# Patient Record
Sex: Female | Born: 1990 | Race: Black or African American | Hispanic: No | Marital: Married | Smoking: Never smoker
Health system: Southern US, Community
[De-identification: ages and names within clinical notes are randomized; demographics above are authoritative.]

## PROBLEM LIST (undated history)

## (undated) DIAGNOSIS — Z789 Other specified health status: Secondary | ICD-10-CM

## (undated) DIAGNOSIS — T7840XA Allergy, unspecified, initial encounter: Secondary | ICD-10-CM

## (undated) HISTORY — DX: Other specified health status: Z78.9

## (undated) HISTORY — PX: WISDOM TOOTH EXTRACTION: SHX21

## (undated) HISTORY — DX: Allergy, unspecified, initial encounter: T78.40XA

---

## 2016-07-07 DIAGNOSIS — J3089 Other allergic rhinitis: Secondary | ICD-10-CM | POA: Insufficient documentation

## 2016-07-07 DIAGNOSIS — J301 Allergic rhinitis due to pollen: Secondary | ICD-10-CM

## 2016-07-07 DIAGNOSIS — R040 Epistaxis: Secondary | ICD-10-CM | POA: Insufficient documentation

## 2016-11-12 ENCOUNTER — Encounter: Payer: Self-pay | Admitting: Certified Nurse Midwife

## 2016-11-12 ENCOUNTER — Other Ambulatory Visit (HOSPITAL_COMMUNITY)
Admission: RE | Admit: 2016-11-12 | Discharge: 2016-11-12 | Disposition: A | Discharge status: Home / Self Care | Payer: BLUE CROSS/BLUE SHIELD | Source: Ambulatory Visit | Attending: Obstetrics & Gynecology | Admitting: Obstetrics & Gynecology

## 2016-11-12 ENCOUNTER — Ambulatory Visit (INDEPENDENT_AMBULATORY_CARE_PROVIDER_SITE_OTHER): Payer: BLUE CROSS/BLUE SHIELD | Admitting: Certified Nurse Midwife

## 2016-11-12 VITALS — BP 100/70 | HR 90 | Resp 16 | Ht 63.5 in | Wt 121.0 lb

## 2016-11-12 DIAGNOSIS — Z124 Encounter for screening for malignant neoplasm of cervix: Secondary | ICD-10-CM

## 2016-11-12 DIAGNOSIS — Z30011 Encounter for initial prescription of contraceptive pills: Secondary | ICD-10-CM | POA: Diagnosis not present

## 2016-11-12 DIAGNOSIS — Z01419 Encounter for gynecological examination (general) (routine) without abnormal findings: Secondary | ICD-10-CM

## 2016-11-12 MED ORDER — NORETHIN ACE-ETH ESTRAD-FE 1-20 MG-MCG PO TABS: 1.0000 | ORAL_TABLET | Freq: Every day | ORAL | 4 refills | Status: DC

## 2016-11-12 NOTE — Patient Instructions (Signed)
General topics  Next pap or exam is  due in 1 year Take a Women's multivitamin Take 1200 mg. of calcium daily - prefer dietary If any concerns in interim to call back  Breast Self-Awareness Practicing breast self-awareness may pick up problems early, prevent significant medical complications, and possibly save your life. By practicing breast self-awareness, you can become familiar with how your breasts look and feel and if your breasts are changing. This allows you to notice changes early. It can also offer you some reassurance that your breast health is good. One way to learn what is normal for your breasts and whether your breasts are changing is to do a breast self-exam. If you find a lump or something that was not present in the past, it is best to contact your caregiver right away. Other findings that should be evaluated by your caregiver include nipple discharge, especially if it is bloody; skin changes or reddening; areas where the skin seems to be pulled in (retracted); or new lumps and bumps. Breast pain is seldom associated with cancer (malignancy), but should also be evaluated by a caregiver. BREAST SELF-EXAM The best time to examine your breasts is 5 7 days after your menstrual period is over.  ExitCare Patient Information 2013 [employer].   Exercise to Stay Healthy Exercise helps you become and stay healthy. EXERCISE IDEAS AND TIPS Choose exercises that:  You enjoy.  Fit into your day. You do not need to exercise really hard to be healthy. You can do exercises at a slow or medium level and stay healthy. You can:  Stretch before and after working out.  Try yoga, Pilates, or tai chi.  Lift weights.  Walk fast, swim, jog, run, climb stairs, bicycle, dance, or rollerskate.  Take aerobic classes. Exercises that burn about 150 calories:  Running 1  miles in 15 minutes.  Playing volleyball for 45 to 60 minutes.  Washing and waxing a car for 45 to 60  minutes.  Playing touch football for 45 minutes.  Walking 1  miles in 35 minutes.  Pushing a stroller 1  miles in 30 minutes.  Playing basketball for 30 minutes.  Raking leaves for 30 minutes.  Bicycling 5 miles in 30 minutes.  Walking 2 miles in 30 minutes.  Dancing for 30 minutes.  Shoveling snow for 15 minutes.  Swimming laps for 20 minutes.  Walking up stairs for 15 minutes.  Bicycling 4 miles in 15 minutes.  Gardening for 30 to 45 minutes.  Jumping rope for 15 minutes.  Washing windows or floors for 45 to 60 minutes. Document Released: 03/28/2010 Document Revised: 05/18/2011 Document Reviewed: 03/28/2010 ExitCare Patient Information 2013 [employer].   Other topics ( that may be useful information):    Sexually Transmitted Disease Sexually transmitted disease (STD) refers to any infection that is passed from person to person during sexual activity. This may happen by way of saliva, semen, blood, vaginal mucus, or urine. Common STDs include:  Gonorrhea.  Chlamydia.  Syphilis.  HIV/AIDS.  Genital herpes.  Hepatitis B and C.  Trichomonas.  Human papillomavirus (HPV).  Pubic lice. CAUSES  An STD may be spread by bacteria, virus, or parasite. A person can get an STD by:  Sexual intercourse with an infected person.  Sharing sex toys with an infected person.  Sharing needles with an infected person.  Having intimate contact with the genitals, mouth, or rectal areas of an infected person. SYMPTOMS  Some people may not have any symptoms, but   they can still pass the infection to others. Different STDs have different symptoms. Symptoms include:  Painful or bloody urination.  Pain in the pelvis, abdomen, vagina, anus, throat, or eyes.  Skin rash, itching, irritation, growths, or sores (lesions). These usually occur in the genital or anal area.  Abnormal vaginal discharge.  Penile discharge in men.  Soft, flesh-colored skin growths in the  genital or anal area.  Fever.  Pain or bleeding during sexual intercourse.  Swollen glands in the groin area.  Yellow skin and eyes (jaundice). This is seen with hepatitis. DIAGNOSIS  To make a diagnosis, your caregiver may:  Take a medical history.  Perform a physical exam.  Take a specimen (culture) to be examined.  Examine a sample of discharge under a microscope.  Perform blood test TREATMENT   Chlamydia, gonorrhea, trichomonas, and syphilis can be cured with antibiotic medicine.  Genital herpes, hepatitis, and HIV can be treated, but not cured, with prescribed medicines. The medicines will lessen the symptoms.  Genital warts from HPV can be treated with medicine or by freezing, burning (electrocautery), or surgery. Warts may come back.  HPV is a virus and cannot be cured with medicine or surgery.However, abnormal areas may be followed very closely by your caregiver and may be removed from the cervix, vagina, or vulva through office procedures or surgery. If your diagnosis is confirmed, your recent sexual partners need treatment. This is true even if they are symptom-free or have a negative culture or evaluation. They should not have sex until their caregiver says it is okay. HOME CARE INSTRUCTIONS  All sexual partners should be informed, tested, and treated for all STDs.  Take your antibiotics as directed. Finish them even if you start to feel better.  Only take over-the-counter or prescription medicines for pain, discomfort, or fever as directed by your caregiver.  Rest.  Eat a balanced diet and drink enough fluids to keep your urine clear or pale yellow.  Do not have sex until treatment is completed and you have followed up with your caregiver. STDs should be checked after treatment.  Keep all follow-up appointments, Pap tests, and blood tests as directed by your caregiver.  Only use latex condoms and water-soluble lubricants during sexual activity. Do not use  petroleum jelly or oils.  Avoid alcohol and illegal drugs.  Get vaccinated for HPV and hepatitis. If you have not received these vaccines in the past, talk to your caregiver about whether one or both might be right for you.  Avoid risky sex practices that can break the skin. The only way to avoid getting an STD is to avoid all sexual activity.Latex condoms and dental dams (for oral sex) will help lessen the risk of getting an STD, but will not completely eliminate the risk. SEEK MEDICAL CARE IF:   You have a fever.  You have any new or worsening symptoms. Document Released: 05/16/2002 Document Revised: 05/18/2011 Document Reviewed: 05/23/2010 [facility] Patient Information 2013 [place].    Domestic Abuse You are being battered or abused if someone close to you hits, pushes, or physically hurts you in any way. You also are being abused if you are forced into activities. You are being sexually abused if you are forced to have sexual contact of any kind. You are being emotionally abused if you are made to feel worthless or if you are constantly threatened. It is important to remember that help is available. No one has the right to abuse you. PREVENTION OF FURTHER  ABUSE  Learn the warning signs of danger. This varies with situations but may include: the use of alcohol, threats, isolation from friends and family, or forced sexual contact. Leave if you feel that violence is going to occur.  If you are attacked or beaten, report it to the police so the abuse is documented. You do not have to press charges. The police can protect you while you or the attackers are leaving. Get the officer's name and badge number and a copy of the report.  Find someone you can trust and tell them what is happening to you: your caregiver, a nurse, clergy member, close friend or family member. Feeling ashamed is natural, but remember that you have done nothing wrong. No one deserves abuse. Document Released:  02/21/2000 Document Revised: 05/18/2011 Document Reviewed: 05/01/2010 ExitCare Patient Information 2013 [employer].    How Much is Too Much Alcohol? Drinking too much alcohol can cause injury, accidents, and health problems. These types of problems can include:   Car crashes.  Falls.  Family fighting (domestic violence).  Drowning.  Fights.  Injuries.  Burns.  Damage to certain organs.  Having a baby with birth defects. ONE DRINK CAN BE TOO MUCH WHEN YOU ARE:  Working.  Pregnant or breastfeeding.  Taking medicines. Ask your doctor.  Driving or planning to drive. If you or someone you know has a drinking problem, get help from a doctor.  Document Released: 12/20/2008 Document Revised: 05/18/2011 Document Reviewed: 12/20/2008 ExitCare Patient Information 2013 [employer]Care[employer].   Smoking Hazards Smoking cigarettes is extremely bad for your health. Tobacco smoke has over 200 known poisons in it. There are over 60 chemicals in tobacco smoke that cause cancer. Some of the chemicals found in cigarette smoke include:   Cyanide.  Benzene.  Formaldehyde.  Methanol (wood alcohol).  Acetylene (fuel used in welding torches).  Ammonia. Cigarette smoke also contains the poisonous gases nitrogen oxide and carbon monoxide.  Cigarette smokers have an increased risk of many serious medical problems and Smoking causes approximately:  90% of all lung cancer deaths in men.  80% of all lung cancer deaths in women.  90% of deaths from chronic obstructive lung disease. Compared with nonsmokers, smoking increases the risk of:  Coronary heart disease by 2 to 4 times.  Stroke by 2 to 4 times.  Men developing lung cancer by 23 times.  Women developing lung cancer by 13 times.  Dying from chronic obstructive lung diseases by 12 times.  . Smoking is the most preventable cause of death and disease in our society.  WHY IS SMOKING ADDICTIVE?  Nicotine is the chemical  agent in tobacco that is capable of causing addiction or dependence.  When you smoke and inhale, nicotine is absorbed rapidly into the bloodstream through your lungs. Nicotine absorbed through the lungs is capable of creating a powerful addiction. Both inhaled and non-inhaled nicotine may be addictive.  Addiction studies of cigarettes and spit tobacco show that addiction to nicotine occurs mainly during the teen years, when young people begin using tobacco products. WHAT ARE THE BENEFITS OF QUITTING?  There are many health benefits to quitting smoking.   Likelihood of developing cancer and heart disease decreases. Health improvements are seen almost immediately.  Blood pressure, pulse rate, and breathing patterns start returning to normal soon after quitting. QUITTING SMOKING   American Lung Association - 1-800-LUNGUSA  American Cancer Society - 1-800-ACS-2345 Document Released: 04/02/2004 Document Revised: 05/18/2011 Document Reviewed: 12/05/2008 ExitCare Patient Information 2013 ExitCare,   LLC.   Stress Management Stress is a state of physical or mental tension that often results from changes in your life or normal routine. Some common causes of stress are:  Death of a loved one.  Injuries or severe illnesses.  Getting fired or changing jobs.  Moving into a new home. Other causes may be:  Sexual problems.  Business or financial losses.  Taking on a large debt.  Regular conflict with someone at home or at work.  Constant tiredness from lack of sleep. It is not just bad things that are stressful. It may be stressful to:  Win the lottery.  Get married.  Buy a new car. The amount of stress that can be easily tolerated varies from person to person. Changes generally cause stress, regardless of the types of change. Too much stress can affect your health. It may lead to physical or emotional problems. Too little stress (boredom) may also become stressful. SUGGESTIONS TO  REDUCE STRESS:  Talk things over with your family and friends. It often is helpful to share your concerns and worries. If you feel your problem is serious, you may want to get help from a professional counselor.  Consider your problems one at a time instead of lumping them all together. Trying to take care of everything at once may seem impossible. List all the things you need to do and then start with the most important one. Set a goal to accomplish 2 or 3 things each day. If you expect to do too many in a single day you will naturally fail, causing you to feel even more stressed.  Do not use alcohol or drugs to relieve stress. Although you may feel better for a short time, they do not remove the problems that caused the stress. They can also be habit forming.  Exercise regularly - at least 3 times per week. Physical exercise can help to relieve that "uptight" feeling and will relax you.  The shortest distance between despair and hope is often a good night's sleep.  Go to bed and get up on time allowing yourself time for appointments without being rushed.  Take a short "time-out" period from any stressful situation that occurs during the day. Close your eyes and take some deep breaths. Starting with the muscles in your face, tense them, hold it for a few seconds, then relax. Repeat this with the muscles in your neck, shoulders, hand, stomach, back and legs.  Take good care of yourself. Eat a balanced diet and get plenty of rest.  Schedule time for having fun. Take a break from your daily routine to relax. HOME CARE INSTRUCTIONS   Call if you feel overwhelmed by your problems and feel you can no longer manage them on your own.  Return immediately if you feel like hurting yourself or someone else. Document Released: 08/19/2000 Document Revised: 05/18/2011 Document Reviewed: 04/11/2007 [facility] Patient Information 2013 [employer].   Oral Contraception Use Oral contraceptive pills  (OCPs) are medicines taken to prevent pregnancy. OCPs work by preventing the ovaries from releasing eggs. The hormones in OCPs also cause the cervical mucus to thicken, preventing the sperm from entering the uterus. The hormones also cause the uterine lining to become thin, not allowing a fertilized egg to attach to the inside of the uterus. OCPs are highly effective when taken exactly as prescribed. However, OCPs do not prevent sexually transmitted diseases (STDs). Safe sex practices, such as using condoms along with an OCP, can help prevent STDs.  Before taking OCPs, you may have a physical exam and Pap test. Your health care provider may also order blood tests if necessary. Your health care provider will make sure you are a good candidate for oral contraception. Discuss with your health care provider the possible side effects of the OCP you may be prescribed. When starting an OCP, it can take 2 to 3 months for the body to adjust to the changes in hormone levels in your body. How to take oral contraceptive pills Your health care provider may advise you on how to start taking the first cycle of OCPs. Otherwise, you can:  Start on day 1 of your menstrual period. You will not need any backup contraceptive protection with this start time.  Start on the first Sunday after your menstrual period or the day you get your prescription. In these cases, you will need to use backup contraceptive protection for the first week.  Start the pill at any time of your cycle. If you take the pill within 5 days of the start of your period, you are protected against pregnancy right away. In this case, you will not need a backup form of birth control. If you start at any other time of your menstrual cycle, you will need to use another form of birth control for 7 days. If your OCP is the type called a minipill, it will protect you from pregnancy after taking it for 2 days (48 hours).  After you have started taking OCPs:  If  you forget to take 1 pill, take it as soon as you remember. Take the next pill at the regular time.  If you miss 2 or more pills, call your health care provider because different pills have different instructions for missed doses. Use backup birth control until your next menstrual period starts.  If you use a 28-day pack that contains inactive pills and you miss 1 of the last 7 pills (pills with no hormones), it will not matter. Throw away the rest of the non-hormone pills and start a new pill pack.  No matter which day you start the OCP, you will always start a new pack on that same day of the week. Have an extra pack of OCPs and a backup contraceptive method available in case you miss some pills or lose your OCP pack. Follow these instructions at home:  Do not smoke.  Always use a condom to protect against STDs. OCPs do not protect against STDs.  Use a calendar to mark your menstrual period days.  Read the information and directions that came with your OCP. Talk to your health care provider if you have questions. Contact a health care provider if:  You develop nausea and vomiting.  You have abnormal vaginal discharge or bleeding.  You develop a rash.  You miss your menstrual period.  You are losing your hair.  You need treatment for mood swings or depression.  You get dizzy when taking the OCP.  You develop acne from taking the OCP.  You become pregnant. Get help right away if:  You develop chest pain.  You develop shortness of breath.  You have an uncontrolled or severe headache.  You develop numbness or slurred speech.  You develop visual problems.  You develop pain, redness, and swelling in the legs. This information is not intended to replace advice given to you by your health care provider. Make sure you discuss any questions you have with your health care provider. Document Released: 02/12/2011  Document Revised: 08/01/2015 Document Reviewed:  08/14/2012 Elsevier Interactive Patient Education  2017 [employer]vier Inc.  

## 2016-11-12 NOTE — Progress Notes (Signed)
26 y.o. G0P0000 Single  African American Fe here to establish gyn care and for annual exam. Periods normal, duration 3-4 days,one heavy day first day only, minimal cramping with OCP use. Denies any problems with OCP and would like to continue. Sexually active, no STD screening needed. Have been together 5 years. Sees Urgent care if needed. No health issues today.    Patient's last menstrual period was 10/19/2016.          Sexually active: Yes.    The current method of family planning is OCP (estrogen/progesterone).    Exercising: No.  The patient does not participate in regular exercise at present. Smoker:  no  Health Maintenance: Pap:  09/2015 normal  History of Abnormal Pap: no MMG:  Never Self Breast exams: no, knows how TDaP: ~2009 Hep C and HIV: Done Labs: if needed, such had health screening all normal   reports that she has never smoked. She has never used smokeless tobacco. She reports that she drinks about 0.6 oz of alcohol per week . She reports that she does not use drugs.  History reviewed. No pertinent past medical history.  History reviewed. No pertinent surgical history.  Current Outpatient Prescriptions  Medication Sig Dispense Refill  . diphenhydrAMINE (BENADRYL) 25 MG tablet Take 25 mg by mouth every 8 (eight) hours as needed.    Marland Kitchen. ibuprofen (ADVIL) 200 MG tablet Take 200 mg by mouth every 6 (six) hours as needed.    . norethindrone-ethinyl estradiol (JUNEL FE 1/20) 1-20 MG-MCG tablet Take 1 tablet by mouth daily.     No current facility-administered medications for this visit.     Family History  Problem Relation Age of Onset  . Rheum arthritis Mother   . Osteoporosis Mother     ROS:  Pertinent items are noted in HPI.  Otherwise, a comprehensive ROS was negative.  Exam:   BP 100/70 (BP Location: Right Arm, Patient Position: Sitting, Cuff Size: Normal)   Pulse 90   Resp 16   Ht 5' 3.5" (1.613 m)   Wt 121 lb (54.9 kg)   LMP 10/19/2016   BMI 21.10 kg/m   Height: 5' 3.5" (161.3 cm) Ht Readings from Last 3 Encounters:  11/12/16 5' 3.5" (1.613 m)    General appearance: alert, cooperative and appears stated age Head: Normocephalic, without obvious abnormality, atraumatic Neck: no adenopathy, supple, symmetrical, trachea midline and thyroid normal to inspection and palpation Lungs: clear to auscultation bilaterally Breasts: normal appearance, no masses or tenderness, No nipple retraction or dimpling, No nipple discharge or bleeding, No axillary or supraclavicular adenopathy Heart: regular rate and rhythm Abdomen: soft, non-tender; no masses,  no organomegaly Extremities: extremities normal, atraumatic, no cyanosis or edema Skin: Skin color, texture, turgor normal. No rashes or lesions Lymph nodes: Cervical, supraclavicular, and axillary nodes normal. No abnormal inguinal nodes palpated Neurologic: Grossly normal   Pelvic: External genitalia:  no lesions              Urethra:  normal appearing urethra with no masses, tenderness or lesions              Bartholin's and Skene's: normal                 Vagina: normal appearing vagina with normal color and discharge, no lesions              Cervix: no cervical motion tenderness, no lesions and nulliparous appearance  Pap taken: Yes.   Bimanual Exam:  Uterus:  normal size, contour, position, consistency, mobility, non-tender              Adnexa: normal adnexa and no mass, fullness, tenderness               Rectovaginal: Confirms               Anus:  normal appearance  Chaperone present: yes  A:  Well Woman with normal exam  Contraception OCP desired, no contraindications to use  P:   Reviewed health and wellness pertinent to exam  Rx Junel Fe 1/20  See order with instructions  Pap smear: yes   counseled on breast self exam, STD prevention, HIV risk factors and prevention, use and side effects of OCP's, adequate intake of calcium and vitamin D, diet and exercise  return  annually or prn  An After Visit Summary was printed and given to the patient.

## 2016-11-13 ENCOUNTER — Ambulatory Visit: Payer: BLUE CROSS/BLUE SHIELD | Admitting: Certified Nurse Midwife

## 2016-11-13 LAB — CYTOLOGY - PAP: Diagnosis: NEGATIVE

## 2017-07-22 DIAGNOSIS — Z3041 Encounter for surveillance of contraceptive pills: Secondary | ICD-10-CM | POA: Insufficient documentation

## 2017-07-22 DIAGNOSIS — L600 Ingrowing nail: Secondary | ICD-10-CM | POA: Insufficient documentation

## 2017-11-18 ENCOUNTER — Ambulatory Visit: Payer: BLUE CROSS/BLUE SHIELD | Admitting: Certified Nurse Midwife

## 2017-11-23 ENCOUNTER — Ambulatory Visit: Payer: BLUE CROSS/BLUE SHIELD | Admitting: Certified Nurse Midwife

## 2017-12-17 ENCOUNTER — Other Ambulatory Visit: Payer: Self-pay

## 2017-12-17 ENCOUNTER — Ambulatory Visit (INDEPENDENT_AMBULATORY_CARE_PROVIDER_SITE_OTHER): Payer: BLUE CROSS/BLUE SHIELD | Admitting: Certified Nurse Midwife

## 2017-12-17 ENCOUNTER — Encounter: Payer: Self-pay | Admitting: Certified Nurse Midwife

## 2017-12-17 VITALS — BP 98/68 | HR 60 | Resp 16 | Ht 63.25 in | Wt 115.0 lb

## 2017-12-17 DIAGNOSIS — Z3041 Encounter for surveillance of contraceptive pills: Secondary | ICD-10-CM

## 2017-12-17 DIAGNOSIS — Z01419 Encounter for gynecological examination (general) (routine) without abnormal findings: Secondary | ICD-10-CM | POA: Diagnosis not present

## 2017-12-17 MED ORDER — NORETHIN ACE-ETH ESTRAD-FE 1-20 MG-MCG PO TABS: 1.0000 | ORAL_TABLET | Freq: Every day | ORAL | 4 refills | Status: DC

## 2017-12-17 NOTE — Progress Notes (Signed)
27 y.o. G0P0000 Single  African American Fe here for annual exam. Periods normal no issues. Contraception OCP. No missed pills or periods. Urgent care for sinus infections only. No partner change engaged!!  No health issues today. Wedding maybe in [place].  Patient's last menstrual period was 11/22/2017 (exact date).          Sexually active: Yes.    The current method of family planning is OCP (estrogen/progesterone).    Exercising: No.  exercise Smoker:  no  Review of Systems  Constitutional: Negative.   HENT: Negative.   Eyes: Negative.   Respiratory: Negative.   Cardiovascular: Negative.   Gastrointestinal: Negative.   Genitourinary:       Loss of sexual interest  Musculoskeletal: Negative.   Skin: Negative.   Neurological: Negative.   Endo/Heme/Allergies: Negative.   Psychiatric/Behavioral: Negative.     Health Maintenance: Pap:  11-12-16 neg History of Abnormal Pap: no MMG:  none Self Breast exams: occ Colonoscopy:  none BMD:   none TDaP:  UTD? Will check Shingles: no Pneumonia: no Hep C and HIV: not done Labs: no   reports that she has never smoked. She has never used smokeless tobacco. She reports that she drinks alcohol. She reports that she does not use drugs.  History reviewed. No pertinent past medical history.  History reviewed. No pertinent surgical history.  Current Outpatient Medications  Medication Sig Dispense Refill  . diphenhydrAMINE (BENADRYL) 25 MG tablet Take 25 mg by mouth every 8 (eight) hours as needed.    . fluticasone (FLONASE) 50 MCG/ACT nasal spray Place into both nostrils daily.    Marland Kitchen ibuprofen (ADVIL) 200 MG tablet Take 200 mg by mouth every 6 (six) hours as needed.    . norethindrone-ethinyl estradiol (JUNEL FE 1/20) 1-20 MG-MCG tablet Take 1 tablet by mouth daily. 3 Package 4   No current facility-administered medications for this visit.     Family History  Problem Relation Age of Onset  . Rheum arthritis Mother   . Osteoporosis  Mother     ROS:  Pertinent items are noted in HPI.  Otherwise, a comprehensive ROS was negative.  Exam:   BP 98/68   Pulse 60   Resp 16   Ht 5' 3.25" (1.607 m)   Wt 115 lb (52.2 kg)   LMP 11/22/2017 (Exact Date)   BMI 20.21 kg/m  Height: 5' 3.25" (160.7 cm) Ht Readings from Last 3 Encounters:  12/17/17 5' 3.25" (1.607 m)  11/12/16 5' 3.5" (1.613 m)    General appearance: alert, cooperative and appears stated age Head: Normocephalic, without obvious abnormality, atraumatic Neck: no adenopathy, supple, symmetrical, trachea midline and thyroid normal to inspection and palpation Lungs: clear to auscultation bilaterally Breasts: normal appearance, no masses or tenderness, No nipple retraction or dimpling, No nipple discharge or bleeding, No axillary or supraclavicular adenopathy Heart: regular rate and rhythm Abdomen: soft, non-tender; no masses,  no organomegaly Extremities: extremities normal, atraumatic, no cyanosis or edema Skin: Skin color, texture, turgor normal. No rashes or lesions Lymph nodes: Cervical, supraclavicular, and axillary nodes normal. No abnormal inguinal nodes palpated Neurologic: Grossly normal   Pelvic: External genitalia:  no lesions              Urethra:  normal appearing urethra with no masses, tenderness or lesions              Bartholin's and Skene's: normal  Vagina: normal appearing vagina with normal color and discharge, no lesions              Cervix: no cervical motion tenderness, no lesions and nulliparous appearance              Pap taken: No. Bimanual Exam:  Uterus:  normal size, contour, position, consistency, mobility, non-tender and anteverted              Adnexa: normal adnexa and no mass, fullness, tenderness               Rectovaginal: Confirms               Anus:  normal appearance, no lesions  Chaperone present: yes  A:  Well Woman with normal exam  Contraception OCP desired  May plan pregnancy in next year  P:    Reviewed health and wellness pertinent to exam  Risks/benefits/warning signs reviewed, denies any problems  Rx Junel Fe 1/20 see order with instructions  Encouraged to have preconceptual appointment 3-6 months before planning to start trying. Given printed information regarding pregnancy planning.  Pap smear: no   counseled on breast self exam, feminine hygiene, use and side effects of OCP's, adequate intake of calcium and vitamin D, diet and exercise  return annually or prn  An After Visit Summary was printed and given to the patient.

## 2017-12-17 NOTE — Patient Instructions (Signed)

## 2018-03-18 ENCOUNTER — Ambulatory Visit: Payer: BLUE CROSS/BLUE SHIELD | Admitting: Certified Nurse Midwife

## 2018-03-22 ENCOUNTER — Other Ambulatory Visit: Payer: Self-pay

## 2018-03-22 ENCOUNTER — Encounter: Payer: Self-pay | Admitting: Certified Nurse Midwife

## 2018-03-22 ENCOUNTER — Ambulatory Visit (INDEPENDENT_AMBULATORY_CARE_PROVIDER_SITE_OTHER): Payer: BLUE CROSS/BLUE SHIELD | Admitting: Certified Nurse Midwife

## 2018-03-22 VITALS — BP 106/68 | HR 64 | Resp 16 | Wt 117.0 lb

## 2018-03-22 DIAGNOSIS — Z3169 Encounter for other general counseling and advice on procreation: Secondary | ICD-10-CM

## 2018-03-22 DIAGNOSIS — Z789 Other specified health status: Secondary | ICD-10-CM

## 2018-03-22 NOTE — Progress Notes (Signed)
28 y.o.Single African American g0p0  Female here for preconceptual counseling with partner. Plans to marry in the next month and would like to try for pregnancy in 3 months. Will continue OCP until that time. No health issues today.   Gynecological History:    Menarche:12 LMP:  Length of cycle:monthly Length of Menses: 4 days GYN infectious disease history:  (Abnormal pap, venereal warts, herpes, or other STD's )No Current Birth Control method:OCP Last time birth control was used:continues on daily.  PMH:  Any history of DM, HTN, epilepsy, Heart Murmur, or thyroid problems? No   If so, when did it begin? Are you or have you ever been anemic?No If so for how long? Have you ever had any accidents? No What type? Do you have any allergies? No Do you take any sedatives or tranquilizers? No Any domestic violence? No Any medications? No   (such as medicationss for acne - certain medications can cause birth defects and Ace inhibitors can cause kidney problems in the fetus.)  Patient's Past Medical History:  Have you ever had surgery related to female organs? No Past pregnancies/ complications/ or miscarriages/ abortions? No ETOH? No Tobacco Use?No Drug use? No  Reviewed Medication list: No Current job exposure risk - toxins/ Lead/ Mercury No Hot tub/ sauna use? No Do you commonly run long distance or do strenuous exercise? No Do you eat a strict vegetarian diet? No  Partners Past Medical History:  Have you ever had surgery related to female organs? No Previous Paternity? No Testicular Injury? No ETOH No Tobacco use: No Drug use No Current medications:none Current job exposure risk toxins/ lead/ mercury No Hot/ tub sauna use? No Do you commonly run long distance or do strenuous exercise? No  Works out a [employer] daily   Patient's Family Medical history: No Partners Family Medical History: No  Ethnic background? Meditterranean/ Asian/Chinese/ Ashkenazi Jews / [place]  /[place] French - [place]  Patients FMH:   Partners FMH:             Multiple Births:  father is a twin, No         Genetic Disorders No , No    Sickle cell No,No Hemophilia No, No      Cystic Fibrosis No, No     Mental retardation No, No Downs Syndrome No, No      Immunization Updates: Rubella Vaccine/ titer Yes Chicken Pox / vaccination Yes Toxoplasmosis exposure (no changing litter box) Yes has Cat, indoor only Tdap for pt. and partner Yes Hepatitis B  Yes Influenza vaccine who may get pregnant during the flu season Yes, Yes  Recommendations: Discussed with patient would encourage Rubella immune status today with blood draw. Discussed concerns with contracting Rubella in pregnancy. Patient request lab today. Discussed 80 % pregnancy rate in the first year of trying to conceive. Discussed maintaining menstrual calendar for fertile days and given BBT calendar with instructions. Questions addressed. Discussed recommendation of:  No ETOH / Tobacco / Drugs  Limit Caffeine   Artificial Sweeteners restricted with sacrine only  No raw beef or soft cheese consumption   Restrict High fat foods  Limit servings of fish and other seafood to twice weekly. No raw fish  Foods associated with Listeria transmission ( e.g., sliced delicatessen meats & Cheese)  No hot / tub Saunas  Regular exercise  Daily Prenatal vitamins   Lab: Rubella Instructed patient to call if concerns or questions. If misses period and positive  pregnancy test or negative needs to call. If no pregnancy by 9 months to call for OV. Keep regular AEX as scheduled.  Rv prn   Note: Patient became nauseated and vomited after blood draw in office area. Given water and crackers and peppermint and felt much better.  Bp 110 /68 P 72. Color normal and ambulation with assistance to front desk with partner.  Time spent with patient / significant other: 38 minutes

## 2018-03-23 ENCOUNTER — Encounter: Payer: Self-pay | Admitting: Certified Nurse Midwife

## 2018-03-23 LAB — RUBELLA SCREEN: RUBELLA: 7.1 {index} (ref >0.99)

## 2018-03-23 NOTE — Patient Instructions (Signed)

## 2018-11-30 ENCOUNTER — Encounter: Payer: Self-pay | Admitting: Allergy and Immunology

## 2018-11-30 ENCOUNTER — Ambulatory Visit (INDEPENDENT_AMBULATORY_CARE_PROVIDER_SITE_OTHER): Payer: BC Managed Care – PPO | Admitting: Allergy and Immunology

## 2018-11-30 ENCOUNTER — Other Ambulatory Visit: Payer: Self-pay

## 2018-11-30 VITALS — BP 118/70 | HR 106 | Temp 97.6°F | Resp 16 | Ht 63.39 in | Wt 118.4 lb

## 2018-11-30 DIAGNOSIS — J4520 Mild intermittent asthma, uncomplicated: Secondary | ICD-10-CM | POA: Insufficient documentation

## 2018-11-30 DIAGNOSIS — H1013 Acute atopic conjunctivitis, bilateral: Secondary | ICD-10-CM

## 2018-11-30 DIAGNOSIS — J3089 Other allergic rhinitis: Secondary | ICD-10-CM | POA: Diagnosis not present

## 2018-11-30 DIAGNOSIS — H1010 Acute atopic conjunctivitis, unspecified eye: Secondary | ICD-10-CM | POA: Insufficient documentation

## 2018-11-30 MED ORDER — LEVOCETIRIZINE DIHYDROCHLORIDE 5 MG PO TABS: 5.0000 mg | ORAL_TABLET | Freq: Every day | ORAL | 5 refills | Status: DC | PRN

## 2018-11-30 MED ORDER — ALBUTEROL SULFATE HFA 108 (90 BASE) MCG/ACT IN AERS: INHALATION_SPRAY | RESPIRATORY_TRACT | 1 refills | Status: DC

## 2018-11-30 MED ORDER — PAZEO 0.7 % OP SOLN: 1.0000 [drp] | Freq: Every day | OPHTHALMIC | 5 refills | Status: DC | PRN

## 2018-11-30 MED ORDER — XHANCE 93 MCG/ACT NA EXHU: 2.0000 "application " | INHALANT_SUSPENSION | Freq: Two times a day (BID) | NASAL | 5 refills | Status: DC

## 2018-11-30 NOTE — Assessment & Plan Note (Signed)
   Aeroallergen avoidance measures have been discussed and provided in written form.  A prescription has been provided for levocetirizine, 5 mg daily as needed.  To avoid diminishing benefit with daily use (tachyphylaxis) of second generation antihistamine, consider alternating every few months between fexofenadine (Allegra) and levocetirizine (Xyzal).  A prescription has been provided for [facility], 2 actuations per nostril twice a day. Proper technique has been discussed and demonstrated.  Nasal saline spray (i.e., Simply Saline) or nasal saline lavage (i.e., NeilMed) is recommended as needed and prior to medicated nasal sprays.  If allergen avoidance measures and medications fail to adequately relieve symptoms, aeroallergen immunotherapy will be considered.

## 2018-11-30 NOTE — Assessment & Plan Note (Addendum)
   Continue to have access to albuterol HFA, 1 to 2 inhalations every 4-6 hours if needed.  Subjective and objective measures of pulmonary function will be followed and the treatment plan will be adjusted accordingly. 

## 2018-11-30 NOTE — Patient Instructions (Addendum)
Seasonal and perennial allergic rhinitis  Aeroallergen avoidance measures have been discussed and provided in written form.  A prescription has been provided for levocetirizine, 5 mg daily as needed.  To avoid diminishing benefit with daily use (tachyphylaxis) of second generation antihistamine, consider alternating every few months between fexofenadine (Allegra) and levocetirizine (Xyzal).  A prescription has been provided for [facility], 2 actuations per nostril twice a day. Proper technique has been discussed and demonstrated.  Nasal saline spray (i.e., Simply Saline) or nasal saline lavage (i.e., NeilMed) is recommended as needed and prior to medicated nasal sprays.  If allergen avoidance measures and medications fail to adequately relieve symptoms, aeroallergen immunotherapy will be considered.  Allergic conjunctivitis  Treatment plan as outlined above for allergic rhinitis.  A prescription has been provided for Pazeo, one drop per eye daily as needed.  I have also recommended eye lubricant drops (i.e., Natural Tears) as needed.  Wheezing  Continue to have access to albuterol HFA, 1 to 2 inhalations every 4-6 hours if needed.  Subjective and objective measures of pulmonary function will be followed and the treatment plan will be adjusted accordingly.   Return in about 3 months (around 03/01/2019), or if symptoms worsen or fail to improve.  Control of Dust Mite Allergen  House dust mites play a major role in allergic asthma and rhinitis.  They occur in environments with high humidity wherever human skin, the food for dust mites is found. High levels have been detected in dust obtained from mattresses, pillows, carpets, upholstered furniture, bed covers, clothes and soft toys.  The principal allergen of the house dust mite is found in its feces.  A gram of dust may contain 1,000 mites and 250,000 fecal particles.  Mite antigen is easily measured in the air during house cleaning  activities.    1. Encase mattresses, including the box spring, and pillow, in an air tight cover.  Seal the zipper end of the encased mattresses with wide adhesive tape. 2. Wash the bedding in water of 130 degrees Farenheit weekly.  Avoid cotton comforters/quilts and flannel bedding: the most ideal bed covering is the dacron comforter. 3. Remove all upholstered furniture from the bedroom. 4. Remove carpets, carpet padding, rugs, and non-washable window drapes from the bedroom.  Wash drapes weekly or use plastic window coverings. 5. Remove all non-washable stuffed toys from the bedroom.  Wash stuffed toys weekly. 6. Have the room cleaned frequently with a vacuum cleaner and a damp dust-mop.  The patient should not be in a room which is being cleaned and should wait 1 hour after cleaning before going into the room. 7. Close and seal all heating outlets in the bedroom.  Otherwise, the room will become filled with dust-laden air.  An electric heater can be used to heat the room. Reduce indoor humidity to less than 50%.  Do not use a humidifier.   Reducing Pollen Exposure  The American Academy of Allergy, Asthma and Immunology suggests the following steps to reduce your exposure to pollen during allergy seasons.    1. Do not hang sheets or clothing out to dry; pollen may collect on these items. 2. Do not mow lawns or spend time around freshly cut grass; mowing stirs up pollen. 3. Keep windows closed at night.  Keep car windows closed while driving. 4. Minimize morning activities outdoors, a time when pollen counts are usually at their highest. 5. Stay indoors as much as possible when pollen counts or humidity is high and on windy  days when pollen tends to remain in the air longer. 6. Use air conditioning when possible.  Many air conditioners have filters that trap the pollen spores. 7. Use a HEPA room air filter to remove pollen form the indoor air you breathe.   Control of Dog or Cat  Allergen  Avoidance is the best way to manage a dog or cat allergy. If you have a dog or cat and are allergic to dog or cats, consider removing the dog or cat from the home. If you have a dog or cat but don't want to find it a new home, or if your family wants a pet even though someone in the household is allergic, here are some strategies that may help keep symptoms at bay:  1. Keep the pet out of your bedroom and restrict it to only a few rooms. Be advised that keeping the dog or cat in only one room will not limit the allergens to that room. 2. Don't pet, hug or kiss the dog or cat; if you do, wash your hands with soap and water. 3. High-efficiency particulate air (HEPA) cleaners run continuously in a bedroom or living room can reduce allergen levels over time. 4. Place electrostatic material sheet in the air inlet vent in the bedroom. 5. Regular use of a high-efficiency vacuum cleaner or a central vacuum can reduce allergen levels. 6. Giving your dog or cat a bath at least once a week can reduce airborne allergen.   Control of Mold Allergen  Mold and fungi can grow on a variety of surfaces provided certain temperature and moisture conditions exist.  Outdoor molds grow on plants, decaying vegetation and soil.  The major outdoor mold, Alternaria and Cladosporium, are found in very high numbers during hot and dry conditions.  Generally, a late Summer - Fall peak is seen for common outdoor fungal spores.  Rain will temporarily lower outdoor mold spore count, but counts rise rapidly when the rainy period ends.  The most important indoor molds are Aspergillus and Penicillium.  Dark, humid and poorly ventilated basements are ideal sites for mold growth.  The next most common sites of mold growth are the bathroom and the kitchen.  Outdoor [employer] 1. Use air conditioning and keep windows closed 2. Avoid exposure to decaying vegetation. 3. Avoid leaf raking. 4. Avoid grain handling. 5. Consider  wearing a face mask if working in moldy areas.  Indoor Mold Control 1. Maintain humidity below 50%. 2. Clean washable surfaces with 5% bleach solution. 3. Remove sources e.g. Contaminated carpets.   Control of Cockroach Allergen  Cockroach allergen has been identified as an important cause of acute attacks of asthma, especially in urban settings.  There are fifty-five species of cockroach that exist in the [place], however only three, the [place], [place] species produce allergen that can affect patients with Asthma.  Allergens can be obtained from fecal particles, egg casings and secretions from cockroaches.    1. Remove food sources. 2. Reduce access to water. 3. Seal access and entry points. 4. Spray runways with 0.5-1% Diazinon or Chlorpyrifos 5. Blow boric acid power under stoves and refrigerator. 6. Place bait stations (hydramethylnon) at feeding sites.

## 2018-11-30 NOTE — Assessment & Plan Note (Signed)
   Treatment plan as outlined above for allergic rhinitis.  A prescription has been provided for Pazeo, one drop per eye daily as needed.  I have also recommended eye lubricant drops (i.e., Natural Tears) as needed. 

## 2018-11-30 NOTE — Progress Notes (Signed)
New Patient Note  RE: Julie Hancock MRN: [id number] DOB: [birth year] Date of Office Visit: 11/30/2018  Referring provider: No ref. provider found Primary care provider: Patient, No Pcp Per  Chief Complaint: Allergic Rhinitis , Sinus Problem, and Wheezing   History of present illness: Julie Hancock is a 28 y.o. female presenting today for evaluation of rhinitis. She reports that she has "always had allergies."  However, her nasal, ocular, and sinus symptoms have progressed significantly, particularly over the past 2 months.  The symptoms seem to be at their worst between 7 PM and 10 PM.  She experiences nasal congestion, rhinorrhea, sneezing, postnasal drainage, nasal pruritus, ocular pruritus, and sinus pressure.  These symptoms are typically most severe during the springtime, however this year they have progressed through the summer.  She has tried "just about all" of the over-the-counter allergy medications without adequate symptom relief.  She was prescribed albuterol last year for wheezing, coughing, and shortness of breath.  She believes that these lower respiratory symptoms are triggered by allergen exposure and sinus symptoms.  The last time she wheeze was approximately 1 month ago.  Assessment and plan: Seasonal and perennial allergic rhinitis  Aeroallergen avoidance measures have been discussed and provided in written form.  A prescription has been provided for levocetirizine, 5 mg daily as needed.  To avoid diminishing benefit with daily use (tachyphylaxis) of second generation antihistamine, consider alternating every few months between fexofenadine (Allegra) and levocetirizine (Xyzal).  A prescription has been provided for [facility], 2 actuations per nostril twice a day. Proper technique has been discussed and demonstrated.  Nasal saline spray (i.e., Simply Saline) or nasal saline lavage (i.e., NeilMed) is recommended as needed and prior to medicated nasal sprays.  If  allergen avoidance measures and medications fail to adequately relieve symptoms, aeroallergen immunotherapy will be considered.  Allergic conjunctivitis  Treatment plan as outlined above for allergic rhinitis.  A prescription has been provided for Pazeo, one drop per eye daily as needed.  I have also recommended eye lubricant drops (i.e., Natural Tears) as needed.  Wheezing  Continue to have access to albuterol HFA, 1 to 2 inhalations every 4-6 hours if needed.  Subjective and objective measures of pulmonary function will be followed and the treatment plan will be adjusted accordingly.   Meds ordered this encounter  Medications  . levocetirizine (XYZAL) 5 MG tablet    Sig: Take 1 tablet (5 mg total) by mouth daily as needed for allergies.    Dispense:  30 tablet    Refill:  5  . Fluticasone Propionate (XHANCE) 93 MCG/ACT EXHU    Sig: Place 2 application into both nostrils 2 (two) times daily.    Dispense:  16 mL    Refill:  5  . Olopatadine HCl (PAZEO) 0.7 % SOLN    Sig: Apply 1 drop to eye daily as needed.    Dispense:  2.5 mL    Refill:  5  . albuterol (VENTOLIN HFA) 108 (90 Base) MCG/ACT inhaler    Sig: 2 puffs every 4-6 hours if needed for coughing or wheezing spells    Dispense:  18 g    Refill:  1    Diagnostics: Spirometry: Normal with an FEV1 of 93% predicted with an FEV1 ratio of 102%. This study was performed while the patient was asymptomatic.  Please see scanned spirometry results for details. Epicutaneous testing: Positive to grass pollen, tree pollen, dust mite antigen, cat hair, and horse epithelia. Intradermal testing: Positive to major  mold mix #1, 2, 3, 4, and cockroach antigen.  Physical examination: Blood pressure 118/70, pulse (!) 106, temperature 97.6 F (36.4 C), temperature source Temporal, resp. rate 16, height 5' 3.39" (1.61 m), weight 118 lb 6.4 oz (53.7 kg), SpO2 96 %.  General: Alert, interactive, in no acute distress. HEENT: TMs pearly  gray, turbinates moderately edematous without discharge, post-pharynx mildly erythematous. Neck: Supple without lymphadenopathy. Lungs: Clear to auscultation without wheezing, rhonchi or rales. CV: Normal S1, S2 without murmurs. Abdomen: Nondistended, nontender. Skin: Warm and dry, without lesions or rashes. Extremities:  No clubbing, cyanosis or edema. Neuro:   Grossly intact.  Review of systems:  Review of systems negative except as noted in HPI / PMHx or noted below: Review of Systems  Constitutional: Negative.   HENT: Negative.   Eyes: Negative.   Respiratory: Negative.   Cardiovascular: Negative.   Gastrointestinal: Negative.   Genitourinary: Negative.   Musculoskeletal: Negative.   Skin: Negative.   Neurological: Negative.   Endo/Heme/Allergies: Negative.   Psychiatric/Behavioral: Negative.     Past medical history:  History reviewed. No pertinent past medical history.  Past surgical history:  History reviewed. No pertinent surgical history.  Family history: Family History  Problem Relation Age of Onset  . Rheum arthritis Mother   . Osteoporosis Mother   . Immunodeficiency Mother   . Allergic rhinitis Mother   . Cancer Maternal Grandfather     Social history: Social History   Socioeconomic History  . Marital status: Single    Spouse name: Not on file  . Number of children: Not on file  . Years of education: Not on file  . Highest education level: Not on file  Occupational History  . Not on file  Social Needs  . Financial resource strain: Not on file  . Food insecurity    Worry: Not on file    Inability: Not on file  . Transportation needs    Medical: Not on file    Non-medical: Not on file  Tobacco Use  . Smoking status: Never Smoker  . Smokeless tobacco: Never Used  Substance and Sexual Activity  . Alcohol use: Yes    Comment: 1 every 3 mths  . Drug use: No  . Sexual activity: Yes    Partners: Male    Birth control/protection: Pill   Lifestyle  . Physical activity    Days per week: Not on file    Minutes per session: Not on file  . Stress: Not on file  Relationships  . Social Musician on phone: Not on file    Gets together: Not on file    Attends religious service: Not on file    Active member of club or organization: Not on file    Attends meetings of clubs or organizations: Not on file    Relationship status: Not on file  . Intimate partner violence    Fear of current or ex partner: Not on file    Emotionally abused: Not on file    Physically abused: Not on file    Forced sexual activity: Not on file  Other Topics Concern  . Not on file  Social History Narrative  . Not on file   Environmental History: The patient lives in a house built in the 1980s with carpeting in the bedroom, gas heat, and central air.  There is no known mold/water damage in the home.  There are 2 dogs and a cat in the home which have  access to her bedroom.  She is a non-smoker.  Allergies as of 11/30/2018      Reactions   Latex    Pollen Extract Itching      Medication List       Accurate as of November 30, 2018 12:28 PM. If you have any questions, ask your nurse or doctor.        STOP taking these medications   fexofenadine 180 MG tablet Commonly known as: ALLEGRA Stopped by: Wellington Hampshire, MD     TAKE these medications   Advil 200 MG tablet Generic drug: ibuprofen Take 200 mg by mouth every 6 (six) hours as needed.   albuterol 108 (90 Base) MCG/ACT inhaler Commonly known as: VENTOLIN HFA 2 puffs every 4-6 hours if needed for coughing or wheezing spells Started by: Wellington Hampshire, MD   diphenhydrAMINE 25 MG tablet Commonly known as: BENADRYL Take 25 mg by mouth every 8 (eight) hours as needed.   levocetirizine 5 MG tablet Commonly known as: XYZAL Take 1 tablet (5 mg total) by mouth daily as needed for allergies. Started by: Wellington Hampshire, MD   NASAL SPRAY NA Place 1 spray into the nose  daily.   norethindrone-ethinyl estradiol 1-20 MG-MCG tablet Commonly known as: Junel FE 1/20 Take 1 tablet by mouth daily.   Pazeo 0.7 % Soln Generic drug: Olopatadine HCl Apply 1 drop to eye daily as needed. Started by: Wellington Hampshire, MD   Charmayne Sheer MCG/ACT Exhu Generic drug: Fluticasone Propionate Place 2 application into both nostrils 2 (two) times daily. Started by: Wellington Hampshire, MD       Known medication allergies: Allergies  Allergen Reactions  . Latex   . Pollen Extract Itching    I appreciate the opportunity to take part in Dione's care. Please do not hesitate to contact me with questions.  Sincerely,   R. Jorene Guest, MD

## 2018-12-21 NOTE — Progress Notes (Deleted)
28 y.o. G0P0000 Single  {Race/ethnicity:17218} Fe here for annual exam.    No LMP recorded.          Sexually active: {yes no:314532}  The current method of family planning is {contraception:315051}.    Exercising: {yes no:314532}  {types:19826} Smoker:  {YES NO:22349}  ROS  Health Maintenance: Pap:  11-12-16 neg History of Abnormal Pap: no MMG:  none Self Breast exams: {YES NO:22349} Colonoscopy:  none BMD:   none TDaP:  UTD Shingles: no Pneumonia: no Hep C and HIV: not done Labs: ***   reports that she has never smoked. She has never used smokeless tobacco. She reports current alcohol use. She reports that she does not use drugs.  No past medical history on file.  No past surgical history on file.  Current Outpatient Medications  Medication Sig Dispense Refill  . albuterol (VENTOLIN HFA) 108 (90 Base) MCG/ACT inhaler 2 puffs every 4-6 hours if needed for coughing or wheezing spells 18 g 1  . diphenhydrAMINE (BENADRYL) 25 MG tablet Take 25 mg by mouth every 8 (eight) hours as needed.    . Fluticasone Propionate (XHANCE) 93 MCG/ACT EXHU Place 2 application into both nostrils 2 (two) times daily. 16 mL 5  . ibuprofen (ADVIL) 200 MG tablet Take 200 mg by mouth every 6 (six) hours as needed.    Marland Kitchen levocetirizine (XYZAL) 5 MG tablet Take 1 tablet (5 mg total) by mouth daily as needed for allergies. 30 tablet 5  . norethindrone-ethinyl estradiol (JUNEL FE 1/20) 1-20 MG-MCG tablet Take 1 tablet by mouth daily. 3 Package 4  . Olopatadine HCl (PAZEO) 0.7 % SOLN Apply 1 drop to eye daily as needed. 2.5 mL 5  . Oxymetazoline HCl (NASAL SPRAY NA) Place 1 spray into the nose daily.     No current facility-administered medications for this visit.     Family History  Problem Relation Age of Onset  . Rheum arthritis Mother   . Osteoporosis Mother   . Immunodeficiency Mother   . Allergic rhinitis Mother   . Cancer Maternal Grandfather     ROS:  Pertinent items are noted in HPI.   Otherwise, a comprehensive ROS was negative.  Exam:   There were no vitals taken for this visit.   Ht Readings from Last 3 Encounters:  11/30/18 5' 3.39" (1.61 m)  12/17/17 5' 3.25" (1.607 m)  11/12/16 5' 3.5" (1.613 m)    General appearance: alert, cooperative and appears stated age Head: Normocephalic, without obvious abnormality, atraumatic Neck: no adenopathy, supple, symmetrical, trachea midline and thyroid {EXAM; THYROID:18604} Lungs: clear to auscultation bilaterally Breasts: {Exam; breast:13139::"normal appearance, no masses or tenderness"} Heart: regular rate and rhythm Abdomen: soft, non-tender; no masses,  no organomegaly Extremities: extremities normal, atraumatic, no cyanosis or edema Skin: Skin color, texture, turgor normal. No rashes or lesions Lymph nodes: Cervical, supraclavicular, and axillary nodes normal. No abnormal inguinal nodes palpated Neurologic: Grossly normal   Pelvic: External genitalia:  no lesions              Urethra:  normal appearing urethra with no masses, tenderness or lesions              Bartholin's and Skene's: normal                 Vagina: normal appearing vagina with normal color and discharge, no lesions              Cervix: {exam; cervix:14595}  Pap taken: {yes no:314532} Bimanual Exam:  Uterus:  {exam; uterus:12215}              Adnexa: {exam; adnexa:12223}               Rectovaginal: Confirms               Anus:  normal sphincter tone, no lesions  Chaperone present: ***  A:  Well Woman with normal exam  P:   Reviewed health and wellness pertinent to exam  Pap smear: {YES NO:22349}  {plan; gyn:5269::"mammogram","pap smear","return annually or prn"}  An After Visit Summary was printed and given to the patient.

## 2018-12-23 ENCOUNTER — Ambulatory Visit: Payer: BLUE CROSS/BLUE SHIELD | Admitting: Certified Nurse Midwife

## 2019-01-18 NOTE — Progress Notes (Signed)
28 y.o. G0P0000 Significant Other  African American Fe here for annual exam. Periods normal, last one slightly later due to stopping pills. May try for pregnancy since the wedding could not happen. May go to [employer]. Has started prenatal vitamins and if pregnancy occurs it will be welcome. No medication changes. No other health issues today.  Patient's last menstrual period was 01/09/2019 (exact date).          Sexually active: Yes.    The current method of family planning is none.    Exercising: No.  exercise Smoker:  no  Review of Systems  Constitutional: Negative.   HENT: Negative.   Eyes: Negative.   Respiratory: Negative.   Cardiovascular: Negative.   Gastrointestinal: Negative.   Genitourinary: Negative.   Musculoskeletal: Negative.   Skin: Negative.   Neurological: Negative.   Endo/Heme/Allergies: Negative.   Psychiatric/Behavioral: Negative.     Health Maintenance: Pap:  11-12-16 neg History of Abnormal Pap: no MMG:  none Self Breast exams: occ Colonoscopy:  none BMD:   none TDaP:  2010 Shingles: no Pneumonia: no Hep C and HIV: not done Labs: no   reports that she has never smoked. She has never used smokeless tobacco. She reports current alcohol use. She reports that she does not use drugs.  History reviewed. No pertinent past medical history.  History reviewed. No pertinent surgical history.  Current Outpatient Medications  Medication Sig Dispense Refill  . albuterol (VENTOLIN HFA) 108 (90 Base) MCG/ACT inhaler 2 puffs every 4-6 hours if needed for coughing or wheezing spells 18 g 1  . diphenhydrAMINE (BENADRYL) 25 MG tablet Take 25 mg by mouth every 8 (eight) hours as needed.    . Fluticasone Propionate (XHANCE) 93 MCG/ACT EXHU Place 2 application into both nostrils 2 (two) times daily. 16 mL 5  . ibuprofen (ADVIL) 200 MG tablet Take 200 mg by mouth every 6 (six) hours as needed.    Marland Kitchen levocetirizine (XYZAL) 5 MG tablet Take 1 tablet (5 mg total) by  mouth daily as needed for allergies. 30 tablet 5  . Olopatadine HCl (PAZEO) 0.7 % SOLN Apply 1 drop to eye daily as needed. 2.5 mL 5   No current facility-administered medications for this visit.     Family History  Problem Relation Age of Onset  . Rheum arthritis Mother   . Osteoporosis Mother   . Immunodeficiency Mother   . Allergic rhinitis Mother   . Cancer Maternal Grandfather     ROS:  Pertinent items are noted in HPI.  Otherwise, a comprehensive ROS was negative.  Exam:   BP 104/64   Pulse 64   Temp (!) 97.1 F (36.2 C) (Skin)   Resp 16   Ht 5' 3.25" (1.607 m)   Wt 114 lb (51.7 kg)   LMP 01/09/2019 (Exact Date)   BMI 20.03 kg/m  Height: 5' 3.25" (160.7 cm) Ht Readings from Last 3 Encounters:  01/20/19 5' 3.25" (1.607 m)  11/30/18 5' 3.39" (1.61 m)  12/17/17 5' 3.25" (1.607 m)    General appearance: alert, cooperative and appears stated age Head: Normocephalic, without obvious abnormality, atraumatic Neck: no adenopathy, supple, symmetrical, trachea midline and thyroid normal to inspection and palpation Lungs: clear to auscultation bilaterally Breasts: normal appearance, no masses or tenderness, No nipple retraction or dimpling, No nipple discharge or bleeding, No axillary or supraclavicular adenopathy Heart: regular rate and rhythm Abdomen: soft, non-tender; no masses,  no organomegaly Extremities: extremities normal, atraumatic, no cyanosis or edema  Skin: Skin color, texture, turgor normal. No rashes or lesions Lymph nodes: Cervical, supraclavicular, and axillary nodes normal. No abnormal inguinal nodes palpated Neurologic: Grossly normal   Pelvic: External genitalia:  no lesions              Urethra:  normal appearing urethra with no masses, tenderness or lesions              Bartholin's and Skene's: normal                 Vagina: normal appearing vagina with normal color and discharge, no lesions              Cervix: no cervical motion tenderness, no  lesions and nulliparous appearance              Pap taken: Yes.   Bimanual Exam:  Uterus:  normal size, contour, position, consistency, mobility, non-tender and anteverted              Adnexa: normal adnexa and no mass, fullness, tenderness               Rectovaginal: Confirms               Anus:  normal appearance, no lesions  Chaperone present: yes  A:  Well Woman with normal exam  Contraception previous OCP, has stopped recently to try for pregnancy  Previous rubella screen immune 03/2018  P:   Reviewed health and wellness pertinent to exam  Aware of need to take prenatal vitamins daily, avoid ETOH use. Call with amenorrhea and for confirmation in office. Patient has information on ovulation tracking. Questions addressed.  Pap smear: yes   counseled on breast self exam, feminine hygiene, adequate intake of calcium and vitamin D, diet and exercise  return annually or prn  An After Visit Summary was printed and given to the patient.

## 2019-01-19 ENCOUNTER — Other Ambulatory Visit: Payer: Self-pay

## 2019-01-20 ENCOUNTER — Encounter: Payer: Self-pay | Admitting: Certified Nurse Midwife

## 2019-01-20 ENCOUNTER — Other Ambulatory Visit (HOSPITAL_COMMUNITY)
Admission: RE | Admit: 2019-01-20 | Discharge: 2019-01-20 | Disposition: A | Discharge status: Home / Self Care | Payer: BC Managed Care – PPO | Source: Ambulatory Visit | Attending: Certified Nurse Midwife | Admitting: Certified Nurse Midwife

## 2019-01-20 ENCOUNTER — Ambulatory Visit: Payer: BC Managed Care – PPO | Admitting: Certified Nurse Midwife

## 2019-01-20 VITALS — BP 104/64 | HR 64 | Temp 97.1°F | Resp 16 | Ht 63.25 in | Wt 114.0 lb

## 2019-01-20 DIAGNOSIS — Z01419 Encounter for gynecological examination (general) (routine) without abnormal findings: Secondary | ICD-10-CM | POA: Diagnosis not present

## 2019-01-20 DIAGNOSIS — Z124 Encounter for screening for malignant neoplasm of cervix: Secondary | ICD-10-CM

## 2019-01-20 NOTE — Patient Instructions (Signed)
General topics  Next pap or exam is  due in 1 year Take a Women's multivitamin Take 1200 mg. of calcium daily - prefer dietary If any concerns in interim to call back  Breast Self-Awareness Practicing breast self-awareness may pick up problems early, prevent significant medical complications, and possibly save your life. By practicing breast self-awareness, you can become familiar with how your breasts look and feel and if your breasts are changing. This allows you to notice changes early. It can also offer you some reassurance that your breast health is good. One way to learn what is normal for your breasts and whether your breasts are changing is to do a breast self-exam. If you find a lump or something that was not present in the past, it is best to contact your caregiver right away. Other findings that should be evaluated by your caregiver include nipple discharge, especially if it is bloody; skin changes or reddening; areas where the skin seems to be pulled in (retracted); or new lumps and bumps. Breast pain is seldom associated with cancer (malignancy), but should also be evaluated by a caregiver. BREAST SELF-EXAM The best time to examine your breasts is 5 7 days after your menstrual period is over.  ExitCare Patient Information 2013 [place].   Exercise to Stay Healthy Exercise helps you become and stay healthy. EXERCISE IDEAS AND TIPS Choose exercises that:  You enjoy.  Fit into your day. You do not need to exercise really hard to be healthy. You can do exercises at a slow or medium level and stay healthy. You can:  Stretch before and after working out.  Try yoga, Pilates, or tai chi.  Lift weights.  Walk fast, swim, jog, run, climb stairs, bicycle, dance, or rollerskate.  Take aerobic classes. Exercises that burn about 150 calories:  Running 1  miles in 15 minutes.  Playing volleyball for 45 to 60 minutes.  Washing and waxing a car for 45 to 60  minutes.  Playing touch football for 45 minutes.  Walking 1  miles in 35 minutes.  Pushing a stroller 1  miles in 30 minutes.  Playing basketball for 30 minutes.  Raking leaves for 30 minutes.  Bicycling 5 miles in 30 minutes.  Walking 2 miles in 30 minutes.  Dancing for 30 minutes.  Shoveling snow for 15 minutes.  Swimming laps for 20 minutes.  Walking up stairs for 15 minutes.  Bicycling 4 miles in 15 minutes.  Gardening for 30 to 45 minutes.  Jumping rope for 15 minutes.  Washing windows or floors for 45 to 60 minutes. Document Released: 03/28/2010 Document Revised: 05/18/2011 Document Reviewed: 03/28/2010 [facility] Patient Information 2013 [place].   Other topics ( that may be useful information):    Sexually Transmitted Disease Sexually transmitted disease (STD) refers to any infection that is passed from person to person during sexual activity. This may happen by way of saliva, semen, blood, vaginal mucus, or urine. Common STDs include:  Gonorrhea.  Chlamydia.  Syphilis.  HIV/AIDS.  Genital herpes.  Hepatitis B and C.  Trichomonas.  Human papillomavirus (HPV).  Pubic lice. CAUSES  An STD may be spread by bacteria, virus, or parasite. A person can get an STD by:  Sexual intercourse with an infected person.  Sharing sex toys with an infected person.  Sharing needles with an infected person.  Having intimate contact with the genitals, mouth, or rectal areas of an infected person. SYMPTOMS  Some people may  they can still pass the infection to others. Different STDs have different symptoms. Symptoms include: °· Painful or bloody urination. °· Pain in the pelvis, abdomen, vagina, anus, throat, or eyes. °· Skin rash, itching, irritation, growths, or sores (lesions). These usually occur in the genital or anal area. °· Abnormal vaginal discharge. °· Penile discharge in men. °· Soft, flesh-colored skin growths in the  genital or anal area. °· Fever. °· Pain or bleeding during sexual intercourse. °· Swollen glands in the groin area. °· Yellow skin and eyes (jaundice). This is seen with hepatitis. °DIAGNOSIS  °To make a diagnosis, your caregiver may: °· Take a medical history. °· Perform a physical exam. °· Take a specimen (culture) to be examined. °· Examine a sample of discharge under a microscope. °· Perform blood test °TREATMENT  °· Chlamydia, gonorrhea, trichomonas, and syphilis can be cured with antibiotic medicine. °· Genital herpes, hepatitis, and HIV can be treated, but not cured, with prescribed medicines. The medicines will lessen the symptoms. °· Genital warts from HPV can be treated with medicine or by freezing, burning (electrocautery), or surgery. Warts may come back. °· HPV is a virus and cannot be cured with medicine or surgery. However, abnormal areas may be followed very closely by your caregiver and may be removed from the cervix, vagina, or vulva through office procedures or surgery. °If your diagnosis is confirmed, your recent sexual partners need treatment. This is true even if they are symptom-free or have a negative culture or evaluation. They should not have sex until their caregiver says it is okay. °HOME CARE INSTRUCTIONS °· All sexual partners should be informed, tested, and treated for all STDs. °· Take your antibiotics as directed. Finish them even if you start to feel better. °· Only take over-the-counter or prescription medicines for pain, discomfort, or fever as directed by your caregiver. °· Rest. °· Eat a balanced diet and drink enough fluids to keep your urine clear or pale yellow. °· Do not have sex until treatment is completed and you have followed up with your caregiver. STDs should be checked after treatment. °· Keep all follow-up appointments, Pap tests, and blood tests as directed by your caregiver. °· Only use latex condoms and water-soluble lubricants during sexual activity. Do not use  petroleum jelly or oils. °· Avoid alcohol and illegal drugs. °· Get vaccinated for HPV and hepatitis. If you have not received these vaccines in the past, talk to your caregiver about whether one or both might be right for you. °· Avoid risky sex practices that can break the skin. °The only way to avoid getting an STD is to avoid all sexual activity. Latex condoms and dental dams (for oral sex) will help lessen the risk of getting an STD, but will not completely eliminate the risk. °SEEK MEDICAL CARE IF:  °· You have a fever. °· You have any new or worsening symptoms. °Document Released: 05/16/2002 Document Revised: 05/18/2011 Document Reviewed: 05/23/2010 °ExitCare® Patient Information ©2013 [employer]. ° ° ° °Domestic Abuse °You are being battered or abused if someone close to you hits, pushes, or physically hurts you in any way. You also are being abused if you are forced into activities. You are being sexually abused if you are forced to have sexual contact of any kind. You are being emotionally abused if you are made to feel worthless or if you are constantly threatened. It is important to remember that help is available. No one has the right to abuse you. °PREVENTION OF FURTHER   abuse you. PREVENTION OF FURTHER ABUSE  Learn the warning signs of danger. This varies with situations but may include: the use of alcohol, threats, isolation from friends and family, or forced sexual contact. Leave if you feel that violence is going to occur.  If you are attacked or beaten, report it to the police so the abuse is documented. You do not have to press charges. The police can protect you while you or the attackers are leaving. Get the officer's name and badge number and a copy of the report.  Find someone you can trust and tell them what is happening to you: your caregiver, a nurse, clergy member, close friend or family member. Feeling ashamed is natural, but remember that you have done nothing wrong. No one deserves abuse. Document Released:  02/21/2000 Document Revised: 05/18/2011 Document Reviewed: 05/01/2010 [facility] Patient Information 2013 [place].    How Much is Too Much Alcohol? Drinking too much alcohol can cause injury, accidents, and health problems. These types of problems can include:   Car crashes.  Falls.  Family fighting (domestic violence).  Drowning.  Fights.  Injuries.  Burns.  Damage to certain organs.  Having a baby with birth defects. ONE DRINK CAN BE TOO MUCH WHEN YOU ARE:  Working.  Pregnant or breastfeeding.  Taking medicines. Ask your doctor.  Driving or planning to drive. If you or someone you know has a drinking problem, get help from a doctor.  Document Released: 12/20/2008 Document Revised: 05/18/2011 Document Reviewed: 12/20/2008 [facility] Patient Information 2013 [place].   Smoking Hazards Smoking cigarettes is extremely bad for your health. Tobacco smoke has over 200 known poisons in it. There are over 60 chemicals in tobacco smoke that cause cancer. Some of the chemicals found in cigarette smoke include:   Cyanide.  Benzene.  Formaldehyde.  Methanol (wood alcohol).  Acetylene (fuel used in welding torches).  Ammonia. Cigarette smoke also contains the poisonous gases nitrogen oxide and carbon monoxide.  Cigarette smokers have an increased risk of many serious medical problems and Smoking causes approximately:  90% of all lung cancer deaths in men.  80% of all lung cancer deaths in women.  90% of deaths from chronic obstructive lung disease. Compared with nonsmokers, smoking increases the risk of:  Coronary heart disease by 2 to 4 times.  Stroke by 2 to 4 times.  Men developing lung cancer by 23 times.  Women developing lung cancer by 13 times.  Dying from chronic obstructive lung diseases by 12 times.  . Smoking is the most preventable cause of death and disease in our society.  WHY IS SMOKING ADDICTIVE?  Nicotine is the chemical  agent in tobacco that is capable of causing addiction or dependence.  When you smoke and inhale, nicotine is absorbed rapidly into the bloodstream through your lungs. Nicotine absorbed through the lungs is capable of creating a powerful addiction. Both inhaled and non-inhaled nicotine may be addictive.  Addiction studies of cigarettes and spit tobacco show that addiction to nicotine occurs mainly during the teen years, when young people begin using tobacco products. WHAT ARE THE BENEFITS OF QUITTING?  There are many health benefits to quitting smoking.   Likelihood of developing cancer and heart disease decreases. Health improvements are seen almost immediately.  Blood pressure, pulse rate, and breathing patterns start returning to normal soon after quitting. QUITTING SMOKING   American Lung Association - 1-800-LUNGUSA  American Cancer Society - 1-800-ACS-2345 Document Released: 04/02/2004 Document Revised: 05/18/2011 Document Reviewed: 12/05/2008  LLC.   Stress Management Stress is a state of physical or mental tension that often results from changes in your life or normal routine. Some common causes of stress are:  Death of a loved one.  Injuries or severe illnesses.  Getting fired or changing jobs.  Moving into a new home. Other causes may be:  Sexual problems.  Business or financial losses.  Taking on a large debt.  Regular conflict with someone at home or at work.  Constant tiredness from lack of sleep. It is not just bad things that are stressful. It may be stressful to:  Win the lottery.  Get married.  Buy a new car. The amount of stress that can be easily tolerated varies from person to person. Changes generally cause stress, regardless of the types of change. Too much stress can affect your health. It may lead to physical or emotional problems. Too little stress (boredom) may also become stressful. SUGGESTIONS TO  REDUCE STRESS:  Talk things over with your family and friends. It often is helpful to share your concerns and worries. If you feel your problem is serious, you may want to get help from a professional counselor.  Consider your problems one at a time instead of lumping them all together. Trying to take care of everything at once may seem impossible. List all the things you need to do and then start with the most important one. Set a goal to accomplish 2 or 3 things each day. If you expect to do too many in a single day you will naturally fail, causing you to feel even more stressed.  Do not use alcohol or drugs to relieve stress. Although you may feel better for a short time, they do not remove the problems that caused the stress. They can also be habit forming.  Exercise regularly - at least 3 times per week. Physical exercise can help to relieve that "uptight" feeling and will relax you.  The shortest distance between despair and hope is often a good night's sleep.  Go to bed and get up on time allowing yourself time for appointments without being rushed.  Take a short "time-out" period from any stressful situation that occurs during the day. Close your eyes and take some deep breaths. Starting with the muscles in your face, tense them, hold it for a few seconds, then relax. Repeat this with the muscles in your neck, shoulders, hand, stomach, back and legs.  Take good care of yourself. Eat a balanced diet and get plenty of rest.  Schedule time for having fun. Take a break from your daily routine to relax. HOME CARE INSTRUCTIONS   Call if you feel overwhelmed by your problems and feel you can no longer manage them on your own.  Return immediately if you feel like hurting yourself or someone else. Document Released: 08/19/2000 Document Revised: 05/18/2011 Document Reviewed: 04/11/2007 [facility] Patient Information 2013 [place].   Preparing for Pregnancy If you are considering  becoming pregnant, make an appointment to see your regular health care provider to learn how to prepare for a safe and healthy pregnancy (preconception care). During a preconception care visit, your health care provider will:  Do a complete physical exam, including a Pap test.  Take a complete medical history.  Give you information, answer your questions, and help you resolve problems. Preconception checklist Medical history  Tell your health care provider about any current or past medical conditions. Your pregnancy or your ability to become pregnant may  be affected by chronic conditions, such as diabetes, chronic hypertension, and thyroid problems.  Include your family's medical history as well as your partner's medical history.  Tell your health care provider about any history of STIs (sexually transmitted infections).These can affect your pregnancy. In some cases, they can be passed to your baby. Discuss any concerns that you have about STIs.  If indicated, discuss the benefits of genetic testing. This testing will show whether there are any genetic conditions that may be passed from you or your partner to your baby.  Tell your health care provider about: ? Any problems you have had with conception or pregnancy. ? Any medicines you take. These include vitamins, herbal supplements, and over-the-counter medicines. ? Your history of immunizations. Discuss any vaccinations that you may need. Diet  Ask your health care provider what to include in a healthy diet that has a balance of nutrients. This is especially important when you are pregnant or preparing to become pregnant.  Ask your health care provider to help you reach a healthy weight before pregnancy. ? If you are overweight, you may be at higher risk for certain complications, such as high blood pressure, diabetes, and preterm birth. ? If you are underweight, you are more likely to have a baby who has a low birth  weight. Lifestyle, work, and home  Let your health care provider know: ? About any lifestyle habits that you have, such as alcohol use, drug use, or smoking. ? About recreational activities that may put you at risk during pregnancy, such as downhill skiing and certain exercise programs. ? Tell your health care provider about any international travel, especially any travel to places with an active [place] virus outbreak. ? About harmful substances that you may be exposed to at work or at home. These include chemicals, pesticides, radiation, or even litter boxes. ? If you do not feel safe at home. Mental health  Tell your health care provider about: ? Any history of mental health conditions, including feelings of depression, sadness, or anxiety. ? Any medicines that you take for a mental health condition. These include herbs and supplements. Home instructions to prepare for pregnancy Lifestyle   Eat a balanced diet. This includes fresh fruits and vegetables, whole grains, lean meats, low-fat dairy products, healthy fats, and foods that are high in fiber. Ask to meet with a nutritionist or registered dietitian for assistance with meal planning and goals.  Get regular exercise. Try to be active for at least 30 minutes a day on most days of the week. Ask your health care provider which activities are safe during pregnancy.  Do not use any products that contain nicotine or tobacco, such as cigarettes and e-cigarettes. If you need help quitting, ask your health care provider.  Do not drink alcohol.  Do not take illegal drugs.  Maintain a healthy weight. Ask your health care provider what weight range is right for you. General instructions  Keep an accurate record of your menstrual periods. This makes it easier for your health care provider to determine your baby's due date.  Begin taking prenatal vitamins and folic acid supplements daily as directed by your health care provider.  Manage any  chronic conditions, such as high blood pressure and diabetes, as told by your health care provider. This is important. How do I know that I am pregnant? You may be pregnant if you have been sexually active and you miss your period. Symptoms of early pregnancy include:  Mild cramping.  Very light vaginal bleeding (spotting).  Feeling unusually tired.  Nausea and vomiting (morning sickness). If you have any of these symptoms and you suspect that you might be pregnant, you can take a home pregnancy test. These tests check for a hormone in your urine (human chorionic gonadotropin, or hCG). A woman's body begins to make this hormone during early pregnancy. These tests are very accurate. Wait until at least the first day after you miss your period to take one. If the test shows that you are pregnant (you get a positive result), call your health care provider to make an appointment for prenatal care. What should I do if I become pregnant?      Make an appointment with your health care provider as soon as you suspect you are pregnant.  Do not use any products that contain nicotine, such as cigarettes, chewing tobacco, and e-cigarettes. If you need help quitting, ask your health care provider.  Do not drink alcoholic beverages. Alcohol is related to a number of birth defects.  Avoid toxic odors and chemicals.  You may continue to have sexual intercourse if it does not cause pain or other problems, such as vaginal bleeding. This information is not intended to replace advice given to you by your health care provider. Make sure you discuss any questions you have with your health care provider. Document Released: 02/06/2008 Document Revised: 02/25/2017 Document Reviewed: 09/15/2015 Elsevier Patient Education  2020 [employer].

## 2019-01-25 LAB — CYTOLOGY - PAP: Diagnosis: NEGATIVE

## 2019-02-17 DIAGNOSIS — Z2821 Immunization not carried out because of patient refusal: Secondary | ICD-10-CM | POA: Diagnosis not present

## 2019-02-17 DIAGNOSIS — M79605 Pain in left leg: Secondary | ICD-10-CM | POA: Diagnosis not present

## 2019-02-24 ENCOUNTER — Other Ambulatory Visit: Payer: Self-pay | Admitting: Certified Nurse Midwife

## 2019-02-24 DIAGNOSIS — Z3041 Encounter for surveillance of contraceptive pills: Secondary | ICD-10-CM

## 2019-03-01 ENCOUNTER — Ambulatory Visit: Payer: BC Managed Care – PPO | Admitting: Allergy and Immunology

## 2019-03-01 ENCOUNTER — Other Ambulatory Visit: Payer: Self-pay

## 2019-03-01 ENCOUNTER — Encounter: Payer: Self-pay | Admitting: Allergy and Immunology

## 2019-03-01 VITALS — BP 92/78 | HR 91 | Temp 98.6°F | Resp 20

## 2019-03-01 DIAGNOSIS — H1013 Acute atopic conjunctivitis, bilateral: Secondary | ICD-10-CM | POA: Diagnosis not present

## 2019-03-01 DIAGNOSIS — J4520 Mild intermittent asthma, uncomplicated: Secondary | ICD-10-CM

## 2019-03-01 DIAGNOSIS — J3089 Other allergic rhinitis: Secondary | ICD-10-CM | POA: Diagnosis not present

## 2019-03-01 NOTE — Assessment & Plan Note (Signed)
   Treatment plan as outlined above for allergic rhinitis.  Pazeo, one drop per eye daily if needed.  I have also recommended eye lubricant drops (i.e., Natural Tears) as needed. 

## 2019-03-01 NOTE — Patient Instructions (Addendum)
Seasonal and perennial allergic rhinitis  Continue appropriate allergen avoidance measures, levocetirizine 5 mg daily as needed, and Xhance, 1-2 actuations per nostril 1-2 times daily as needed.  Nasal saline spray (i.e., Simply Saline) or nasal saline lavage (i.e., NeilMed) is recommended as needed and prior to medicated nasal sprays.  If allergen avoidance measures and medications fail to adequately relieve symptoms, aeroallergen immunotherapy will be considered.  Allergic conjunctivitis  Treatment plan as outlined above for allergic rhinitis.  Pazeo, one drop per eye daily if needed.  I have also recommended eye lubricant drops (i.e., Natural Tears) as needed.  Wheezing  Albuterol HFA, 1 to 2 inhalations every 4-6 hours if needed.   Return in about 6-12 months, or if symptoms worsen or fail to improve.

## 2019-03-01 NOTE — Assessment & Plan Note (Signed)
   Albuterol HFA, 1 to 2 inhalations every 4-6 hours if needed. 

## 2019-03-01 NOTE — Assessment & Plan Note (Signed)
   Continue appropriate allergen avoidance measures, levocetirizine 5 mg daily as needed, and Xhance, 1-2 actuations per nostril 1-2 times daily as needed.  Nasal saline spray (i.e., Simply Saline) or nasal saline lavage (i.e., NeilMed) is recommended as needed and prior to medicated nasal sprays.  If allergen avoidance measures and medications fail to adequately relieve symptoms, aeroallergen immunotherapy will be considered.

## 2019-03-01 NOTE — Progress Notes (Signed)
    Follow-up Note  RE: Julie Hancock MRN: [id number] DOB: [birth year] Date of Office Visit: 03/01/2019  Primary care provider: Patient, No Pcp Per Referring provider: No ref. provider found  History of present illness: Julie Hancock is a 28 y.o. female with allergic rhinoconjunctivitis and wheezing presenting today for follow-up.  She was previously seen in this clinic for her initial evaluation on November 30, 2018.  She reports that her nasal and ocular allergy symptoms have been well controlled with levocetirizine 5 mg daily and XHANCE, 2 sprays per nostril twice daily.  She did experience 1 nosebleed and therefore started using the Xhance once daily.  In addition, she put a humidifier in her bedroom and has not had recurrence of epistaxis.  She has not wheezed in the interval since her previous visit.  She needs no refills today.  Assessment and plan: Seasonal and perennial allergic rhinitis  Continue appropriate allergen avoidance measures, levocetirizine 5 mg daily as needed, and Xhance, 1-2 actuations per nostril 1-2 times daily as needed.  Nasal saline spray (i.e., Simply Saline) or nasal saline lavage (i.e., NeilMed) is recommended as needed and prior to medicated nasal sprays.  If allergen avoidance measures and medications fail to adequately relieve symptoms, aeroallergen immunotherapy will be considered.  Allergic conjunctivitis  Treatment plan as outlined above for allergic rhinitis.  Pazeo, one drop per eye daily if needed.  I have also recommended eye lubricant drops (i.e., Natural Tears) as needed.  Wheezing  Albuterol HFA, 1 to 2 inhalations every 4-6 hours if needed.   Diagnostics: Spirometry:  Normal with an FEV1 of 97% predicted. This study was performed while the patient was asymptomatic.  Please see scanned spirometry results for details.    Physical examination: Blood pressure 92/78, pulse 91, temperature 98.6 F (37 C), temperature source Oral,  resp. rate 20, SpO2 95 %.  General: Alert, interactive, in no acute distress. HEENT: TMs pearly gray, turbinates minimally edematous without discharge, post-pharynx unremarkable. Neck: Supple without lymphadenopathy. Lungs: Clear to auscultation without wheezing, rhonchi or rales. CV: Normal S1, S2 without murmurs. Skin: Warm and dry, without lesions or rashes.  The following portions of the patient's history were reviewed and updated as appropriate: allergies, current medications, past family history, past medical history, past social history, past surgical history and problem list.  Current Outpatient Medications  Medication Sig Dispense Refill  . albuterol (VENTOLIN HFA) 108 (90 Base) MCG/ACT inhaler 2 puffs every 4-6 hours if needed for coughing or wheezing spells 18 g 1  . diphenhydrAMINE (BENADRYL) 25 MG tablet Take 25 mg by mouth every 8 (eight) hours as needed.    . Fluticasone Propionate (XHANCE) 93 MCG/ACT EXHU Place 2 application into both nostrils 2 (two) times daily. 16 mL 5  . ibuprofen (ADVIL) 200 MG tablet Take 200 mg by mouth every 6 (six) hours as needed.    Marland Kitchen levocetirizine (XYZAL) 5 MG tablet Take 1 tablet (5 mg total) by mouth daily as needed for allergies. 30 tablet 5  . Olopatadine HCl (PAZEO) 0.7 % SOLN Apply 1 drop to eye daily as needed. 2.5 mL 5   No current facility-administered medications for this visit.    Allergies  Allergen Reactions  . Latex   . Pollen Extract Itching    I appreciate the opportunity to take part in Julie Hancock's care. Please do not hesitate to contact me with questions.  Sincerely,   R. Edgar Frisk, MD

## 2019-03-13 ENCOUNTER — Other Ambulatory Visit: Payer: Self-pay | Admitting: Allergy and Immunology

## 2019-03-20 ENCOUNTER — Other Ambulatory Visit: Payer: Self-pay

## 2019-03-20 ENCOUNTER — Telehealth: Payer: Self-pay

## 2019-03-20 MED ORDER — XHANCE 93 MCG/ACT NA EXHU: 2.0000 | INHALANT_SUSPENSION | Freq: Two times a day (BID) | NASAL | 12 refills | Status: DC

## 2019-03-20 NOTE — Telephone Encounter (Signed)
Prior auth for Julie Hancock has been approved and sent to the pharmacy.   Effective from 03/20/2019 through 03/18/2020.

## 2019-05-22 ENCOUNTER — Encounter: Payer: Self-pay | Admitting: Certified Nurse Midwife

## 2019-05-24 ENCOUNTER — Encounter: Payer: Self-pay | Admitting: Certified Nurse Midwife

## 2019-08-30 ENCOUNTER — Ambulatory Visit: Payer: BC Managed Care – PPO | Admitting: Allergy and Immunology

## 2019-10-02 NOTE — Progress Notes (Signed)
64 yrs [place] American Married female G0P0000 here for discussion of desires to be pregnant.  Married very recently but has been together with husband for about 10 years.  She stopped her OCPs in 12/2018.  Had been on OCPs for about 10 or 11 years.  Cycles are not regular.  One month will be about about 28 days and then the next month will be more like 36 + days.  Pt has been keeping tract of cycles.  has done ovulation testing. She's had at least one positive test but not every month is positive.   She is currently taking a PNV.  Rubella testing immune 03/2018.  LMP:  09/07/2019.  PMed hx:  allergies PSurg Hx: wisdom teeth extraction Smoker: No  Spouse PMed Hx: neg Spouse PSurg hx:  neg Spouse smokes: No, he dose smoke marijuana  D/w pt components of fertility evaluation including testing for ovulation, semen analysis testing, and evaluation of cavity of uterus and for tubal patency.  Speed of this evaluation and proceeding with fertility medication will depend on desires and pt and her spouse.  They are ready to move along at this time.  Assessment:  Primary infertility  Plan:   1.  Day 3 FSH testing.  Pt will call with onset of cycle.  Clearly understands this can only be done on day 3 of cycle  2.  Semen analysis kit and order faxed to [place] in [place].  Information about scheduling appt for semen analysis reviewed.  Release of information form for this given to pt for both she and spouse to sign.  3.  Due to irregular cycles, ovulation induction agents discussed.  Femara and Clomid discussed.  Risks reviewed including questionable ovarian cancer risk.  Will start with Femara 76m day 5-9 of cycle.  Increased risk of twin and triplets reviewed.    4.  Will plan day 3 FSH with onset of cycle.  Order placed.  Pt aware to let me know when cycle starts and that this would be postponed to next month if ends up falling over a weekend.  5. Will need SHGM to evaluate for tubal  patency in 3-4 months if no pregnancy at that point.  Pt understands this is done timed with menstrual cycle days 1-9.  6.  Continue PNV.  7.  Tdap recommended.  Declines today.  About 30 minutes spent in total with pt

## 2019-10-03 ENCOUNTER — Encounter: Payer: Self-pay | Admitting: Obstetrics & Gynecology

## 2019-10-03 ENCOUNTER — Ambulatory Visit: Payer: BC Managed Care – PPO | Admitting: Obstetrics & Gynecology

## 2019-10-03 ENCOUNTER — Other Ambulatory Visit: Payer: Self-pay

## 2019-10-03 VITALS — BP 108/68 | HR 84 | Resp 12 | Ht 63.0 in | Wt 111.2 lb

## 2019-10-03 DIAGNOSIS — N979 Female infertility, unspecified: Secondary | ICD-10-CM

## 2019-10-03 MED ORDER — LETROZOLE 2.5 MG PO TABS: ORAL_TABLET | ORAL | 0 refills | Status: DC

## 2019-10-04 ENCOUNTER — Encounter: Payer: Self-pay | Admitting: Obstetrics & Gynecology

## 2019-10-04 ENCOUNTER — Telehealth: Payer: Self-pay

## 2019-10-04 NOTE — Telephone Encounter (Signed)
Pt sent following Mychart message:   Christiane, Sistare, MD 29 minutes ago (10:13 AM)   See attached consent form.    Placed printed consent form on Dr Rondel Baton desk for review and then scan.

## 2019-10-07 ENCOUNTER — Other Ambulatory Visit: Payer: Self-pay | Admitting: Obstetrics & Gynecology

## 2019-10-07 DIAGNOSIS — Z3041 Encounter for surveillance of contraceptive pills: Secondary | ICD-10-CM

## 2019-10-09 NOTE — Telephone Encounter (Signed)
Medication refill request: Letrozole 2.5mg   Last AEX:  01/20/19 Next AEX: not yet scheudled Last MMG (if hormonal medication request): Never Refill authorized: 10/0

## 2019-10-10 ENCOUNTER — Telehealth: Payer: Self-pay | Admitting: Obstetrics & Gynecology

## 2019-10-10 DIAGNOSIS — N979 Female infertility, unspecified: Secondary | ICD-10-CM

## 2019-10-10 NOTE — Telephone Encounter (Signed)
Patient's cycle started today and she need to schedule [facility] labs.

## 2019-10-10 NOTE — Telephone Encounter (Signed)
Spoke with patient. LMP 10/10/19. Calling to schedule day 3 FSH and Tdap, discussed at 10/03/19 OV w/ Dr. Hyacinth Meeker.   Nurse visit scheduled for 10/12/19 at 9:45am for labs and Tdap.   Patient already has Femara RX, will plan to start on day 5 of menses, 10/14/19.   Patient states she will need to return for additional ovulation labs in a few weeks. Advised patient I will review with Dr. Hyacinth Meeker and return call to advise. Patient agreeable.    Dr. Hyacinth Meeker - please advise on additional labs.

## 2019-10-11 NOTE — Telephone Encounter (Signed)
Future order [facility] is already entered.  Agree with doing this on 10/12/2019.  As she is going to take Femara on day 5, she will need a day 23 progesterone level on August 25th.  Ok to schedule this as well.  Thanks.

## 2019-10-11 NOTE — Telephone Encounter (Signed)
Spoke with patient, advised per Dr. Hyacinth Meeker.  Lab appt scheduled for 11/01/19 at 8:55am.  Future lab order placed.  Patient verbalizes understanding and is agreeable.  Encounter closed.

## 2019-10-12 ENCOUNTER — Ambulatory Visit: Payer: Self-pay

## 2019-10-12 ENCOUNTER — Other Ambulatory Visit: Payer: Self-pay

## 2019-10-12 ENCOUNTER — Ambulatory Visit (INDEPENDENT_AMBULATORY_CARE_PROVIDER_SITE_OTHER): Payer: BC Managed Care – PPO

## 2019-10-12 VITALS — BP 118/64 | HR 68 | Resp 14 | Ht 63.0 in | Wt 110.0 lb

## 2019-10-12 DIAGNOSIS — Z23 Encounter for immunization: Secondary | ICD-10-CM

## 2019-10-12 DIAGNOSIS — N979 Female infertility, unspecified: Secondary | ICD-10-CM | POA: Diagnosis not present

## 2019-10-13 LAB — FOLLICLE STIMULATING HORMONE: FSH: 5.2 m[IU]/mL

## 2019-10-18 ENCOUNTER — Telehealth: Payer: Self-pay

## 2019-10-18 NOTE — Telephone Encounter (Signed)
10/12/19 Day 3 FSH 5.2  11/01/19 day 23 progesterone scheduled To start Femara with menses.   Copy of 8/9/21semen analysis to covering provider, Dr. Oscar La. Interpretation: normozoospermia. All parameters within normal range.   Dr. Oscar La -please review.

## 2019-10-18 NOTE — Telephone Encounter (Signed)
Dr. Oscar La reviewed semen analysis.   Patient notified of normal results.   Will plan to proceed as scheduled with Day 23 progesterone.   Questions answered.   Copy of report to scan.   Encounter closed.

## 2019-10-18 NOTE — Telephone Encounter (Signed)
Patient is calling regarding semen analysis results.

## 2019-11-01 ENCOUNTER — Other Ambulatory Visit: Payer: Self-pay

## 2019-11-01 ENCOUNTER — Other Ambulatory Visit (INDEPENDENT_AMBULATORY_CARE_PROVIDER_SITE_OTHER): Payer: BC Managed Care – PPO

## 2019-11-01 DIAGNOSIS — N979 Female infertility, unspecified: Secondary | ICD-10-CM

## 2019-11-02 LAB — PROGESTERONE: Progesterone: 15.4 ng/mL

## 2019-11-06 ENCOUNTER — Telehealth: Payer: Self-pay

## 2019-11-06 ENCOUNTER — Encounter: Payer: Self-pay | Admitting: Obstetrics & Gynecology

## 2019-11-06 NOTE — Telephone Encounter (Signed)
Pa for xhance approved thru cover my meds

## 2019-11-06 NOTE — Telephone Encounter (Signed)
Routing to Dr Hyacinth Meeker for review and recommendations. Please advise   Progesterone results 15.4 from 11/01/19.

## 2019-11-06 NOTE — Telephone Encounter (Signed)
Pt sent following Mychart message:   [name], [name], MD 6 hours ago (10:40 AM)   Good Morning, Just checking in on my test results. If I was not ovulating does that mean I need to take femara again this month on my cycle again? I have been checking my ovulation at home and I also have not tested positive for ovulation.   Best,   Julie Hancock

## 2019-11-08 NOTE — Telephone Encounter (Signed)
Yes this indicated ovulation.  She should call if starts cycle for Femara again.  If doesn't start by day 30, should take a UPT.  Thanks.

## 2019-11-08 NOTE — Telephone Encounter (Signed)
Spoke with pt. Pt given results and recommendations per Dr Miller. Pt agreeable and verbalized understanding.  Encounter closed.  

## 2019-11-17 ENCOUNTER — Telehealth: Payer: Self-pay | Admitting: Obstetrics & Gynecology

## 2019-11-17 NOTE — Telephone Encounter (Signed)
Spoke with pt . Pt states calling to let Dr Hyacinth Meeker she had a positive UPT on 11/16/19. LMP 10/10/19 Took Femara on 8/7-11  Had Progesterone lab on 8/25-  15.4  Pt advised to have OV for pregnancy confirmation. Pt scheduled with Dr Hyacinth Meeker on 11/20/19 at 1000 am. Pt agreeable and verbalized understanding of date and time of appt.   Routing to Dr Hyacinth Meeker for [facility]  Encounter closed.

## 2019-11-17 NOTE — Telephone Encounter (Signed)
Patient had a positive otc pregnancy test

## 2019-11-17 NOTE — Progress Notes (Signed)
GYNECOLOGY  VISIT  CC:   Confirmation of pregnancy  HPI: 29 y.o. G0P0000 Married   Black or [place] American Other or two or more races female here for pregnancy confirmation.  LMP 10/10/2019.  EGA 5 6/7 weeks and [facility] 07/16/2020.  She is having breast tenderness.  Denies vaginal bleeding.  Has been having a little cramping.  She's done two home UPTs that were both positive.  No cats in the home.  Tdap 10/12/2019.  Had chicken pox.  Aware flu vaccine safe and recommended.   Fish/shellfish/mercury discuss.  Patient does not eat fish.  Unpasteurized cheese/juices discussed.  Nitrites in foods disucssed.  Exercise and intercourse discussed.  Fetal DNA particle testing discussed.  First trimester down's testing discussed.  Cystic fibrosis discussed.  Sickle cell testing discussed.    GYNECOLOGIC HISTORY: Patient's last menstrual period was 10/10/2019 (exact date). Contraception: none Menopausal hormone therapy: none  Patient Active Problem List   Diagnosis Date Noted  . Allergic conjunctivitis 11/30/2018  . Wheezing 11/30/2018  . Encounter for surveillance of contraceptive pills 07/22/2017  . Seasonal and perennial allergic rhinitis 07/07/2016    Past Medical History:  Diagnosis Date  . Allergies     History reviewed. No pertinent surgical history.  MEDS:   Current Outpatient Medications on File Prior to Visit  Medication Sig Dispense Refill  . albuterol (VENTOLIN HFA) 108 (90 Base) MCG/ACT inhaler 2 puffs every 4-6 hours if needed for coughing or wheezing spells 18 g 1  . diphenhydrAMINE (BENADRYL) 25 MG tablet Take 25 mg by mouth every 8 (eight) hours as needed.    . Fluticasone Propionate (XHANCE) 93 MCG/ACT EXHU Place 2 puffs into both nostrils 2 (two) times daily. 32 mL 12  . Prenatal Vit-Fe Fumarate-FA (PRENATAL VITAMIN PO) Take by mouth.     No current facility-administered medications on file prior to visit.    ALLERGIES: Latex and Pollen extract  Family History   Problem Relation Age of Onset  . Rheum arthritis Mother   . Osteoporosis Mother   . Immunodeficiency Mother   . Allergic rhinitis Mother   . Cancer Maternal Grandfather     SH:  Married, non smoker  Review of Systems  Constitutional: Negative.   HENT: Negative.   Eyes: Negative.   Respiratory: Negative.   Cardiovascular: Negative.   Gastrointestinal: Negative.   Endocrine: Negative.   Genitourinary: Negative.   Musculoskeletal: Negative.   Skin: Negative.   Allergic/Immunologic: Negative.   Neurological: Negative.   Hematological: Negative.   Psychiatric/Behavioral: Negative.     PHYSICAL EXAMINATION:    Wt 111 lb (50.3 kg)   LMP 10/10/2019 (Exact Date)   BMI 19.66 kg/m     General appearance: alert, cooperative and appears stated age   Assessment: Amenorrhea with home positive UPT Cramping  Plan: HCG quant today.  Plan to repeat in 48 hours. Return for PUS prior to transfer of care. Signs/symptoms for pain/bleeding discussed with pt   20 minutes spent with pt.

## 2019-11-20 ENCOUNTER — Encounter: Payer: Self-pay | Admitting: Obstetrics & Gynecology

## 2019-11-20 ENCOUNTER — Telehealth: Payer: Self-pay | Admitting: Obstetrics & Gynecology

## 2019-11-20 ENCOUNTER — Other Ambulatory Visit: Payer: Self-pay

## 2019-11-20 ENCOUNTER — Ambulatory Visit: Payer: BC Managed Care – PPO | Admitting: Obstetrics & Gynecology

## 2019-11-20 VITALS — BP 118/70 | HR 68 | Resp 16 | Wt 111.0 lb

## 2019-11-20 DIAGNOSIS — N912 Amenorrhea, unspecified: Secondary | ICD-10-CM | POA: Diagnosis not present

## 2019-11-20 LAB — POCT URINE PREGNANCY: Preg Test, Ur: POSITIVE — AB

## 2019-11-20 LAB — BETA HCG QUANT (REF LAB): hCG Quant: 13815 m[IU]/mL

## 2019-11-20 NOTE — Addendum Note (Signed)
Addended by: Jerene Bears on: 11/20/2019 11:01 AM   Modules accepted: Orders

## 2019-11-20 NOTE — Telephone Encounter (Signed)
Spoke with patient regarding benefits for recommended ultrasound. Patient is aware that ultrasound is transvaginal. Patient acknowledges understanding of information presented. Patient is aware of cancellation policy. Patient scheduled appointment for 12/14/2019 at 0400PM with M. Suzanne Miller, MD. Encounter closed. 

## 2019-11-22 ENCOUNTER — Telehealth: Payer: Self-pay | Admitting: *Deleted

## 2019-11-22 ENCOUNTER — Other Ambulatory Visit (INDEPENDENT_AMBULATORY_CARE_PROVIDER_SITE_OTHER): Payer: BC Managed Care – PPO

## 2019-11-22 ENCOUNTER — Other Ambulatory Visit: Payer: Self-pay

## 2019-11-22 DIAGNOSIS — Z3201 Encounter for pregnancy test, result positive: Secondary | ICD-10-CM | POA: Diagnosis not present

## 2019-11-22 DIAGNOSIS — O0281 Inappropriate change in quantitative human chorionic gonadotropin (hCG) in early pregnancy: Secondary | ICD-10-CM

## 2019-11-22 LAB — BETA HCG QUANT (REF LAB): hCG Quant: 18924 m[IU]/mL

## 2019-11-22 NOTE — Telephone Encounter (Signed)
Note being written timed out so additional triage noted written.

## 2019-11-22 NOTE — Progress Notes (Signed)
STAT Beta Hcg orders placed per Dr Hyacinth Meeker.

## 2019-11-22 NOTE — Telephone Encounter (Signed)
Please let pt know HCG has increased but not as much as I'd like to see.  Is there anyway to add a PUS to tomorrow's schedule?  If not, please give precautions to her for pain/bleeding and also see if this can be done as outpatient this week if possible.  Thank you.

## 2019-11-22 NOTE — Telephone Encounter (Signed)
11/22/19 Beta Hcg 18,924  Patient is scheduled for PUS on 12/14/19.   Routing to Dr. Hyacinth Meeker to review.

## 2019-11-22 NOTE — Telephone Encounter (Signed)
Spoke with patient, advised per Dr. Hyacinth Meeker.  Patient denies vaginal bleeding or pain, MAU precautions reviewed.  Advised patient RN will call to schedule OB ultrasound and return call with appt details.  Patient verbalizes understanding and is agreeable.   Order placed for STAT OB US less than 14 wks w/ transvaginal at [facility], [address], [employer].   Call placed to [facility] Main radiology scheduling, spoke with Tasha. Patient scheduled for 11/23/19 at 3:45pm, arrive at 3:30pm. Arrive with full bladder.   Call returned to patient, advised of appt details as seen above. Patient verbalizes understanding and is agreeable.   Routing to Dr. Hyacinth Meeker to complete note and close encounter.

## 2019-11-23 ENCOUNTER — Ambulatory Visit
Admission: RE | Admit: 2019-11-23 | Discharge: 2019-11-23 | Disposition: A | Discharge status: Home / Self Care | Payer: BC Managed Care – PPO | Source: Ambulatory Visit | Attending: Obstetrics & Gynecology | Admitting: Obstetrics & Gynecology

## 2019-11-23 DIAGNOSIS — Z3A01 Less than 8 weeks gestation of pregnancy: Secondary | ICD-10-CM | POA: Diagnosis not present

## 2019-11-23 DIAGNOSIS — O0281 Inappropriate change in quantitative human chorionic gonadotropin (hCG) in early pregnancy: Secondary | ICD-10-CM | POA: Insufficient documentation

## 2019-11-23 DIAGNOSIS — N854 Malposition of uterus: Secondary | ICD-10-CM | POA: Diagnosis not present

## 2019-11-23 NOTE — Telephone Encounter (Signed)
Spoke with pt. Pt given update from Dr Hyacinth Meeker. Pt agreeable and verbalized understanding. Pt states has OB providers in mind and will make appt. Pt aware of call return for official read of OB ultrasound.

## 2019-11-23 NOTE — Telephone Encounter (Signed)
Please let pt know that the official read from the ultrasound is not complete but her ovaries look fine, there is a gestational sac, yolk sac and fetal pole.  The presence of a yolk sac typically means that there is not an ectopic pregnancy.  This all looks good.  We didn't see a heart beat but that is ok at this gestational age.  Ok to transfer care.  I'm not sure why the second level was a little off but that's ok.  We will follow up after the official read is finalized.

## 2019-11-28 ENCOUNTER — Other Ambulatory Visit: Payer: BC Managed Care – PPO | Admitting: Obstetrics & Gynecology

## 2019-11-28 ENCOUNTER — Other Ambulatory Visit: Payer: BC Managed Care – PPO

## 2019-11-30 DIAGNOSIS — Z3201 Encounter for pregnancy test, result positive: Secondary | ICD-10-CM | POA: Diagnosis not present

## 2019-11-30 DIAGNOSIS — Z3401 Encounter for supervision of normal first pregnancy, first trimester: Secondary | ICD-10-CM | POA: Diagnosis not present

## 2019-11-30 LAB — OB RESULTS CONSOLE HIV ANTIBODY (ROUTINE TESTING): HIV: NONREACTIVE

## 2019-11-30 LAB — OB RESULTS CONSOLE ABO/RH: RH Type: POSITIVE

## 2019-11-30 LAB — OB RESULTS CONSOLE HEPATITIS B SURFACE ANTIGEN: Hepatitis B Surface Ag: NEGATIVE

## 2019-11-30 LAB — OB RESULTS CONSOLE RUBELLA ANTIBODY, IGM: Rubella: IMMUNE

## 2019-12-13 DIAGNOSIS — Z3401 Encounter for supervision of normal first pregnancy, first trimester: Secondary | ICD-10-CM | POA: Diagnosis not present

## 2019-12-13 DIAGNOSIS — Z3490 Encounter for supervision of normal pregnancy, unspecified, unspecified trimester: Secondary | ICD-10-CM | POA: Diagnosis not present

## 2019-12-13 LAB — OB RESULTS CONSOLE GC/CHLAMYDIA
Chlamydia: NEGATIVE
Gonorrhea: NEGATIVE

## 2019-12-14 ENCOUNTER — Other Ambulatory Visit: Payer: BC Managed Care – PPO | Admitting: Obstetrics & Gynecology

## 2019-12-14 ENCOUNTER — Other Ambulatory Visit: Payer: BC Managed Care – PPO

## 2020-01-22 ENCOUNTER — Ambulatory Visit: Payer: BC Managed Care – PPO | Admitting: Certified Nurse Midwife

## 2020-01-24 DIAGNOSIS — Z3482 Encounter for supervision of other normal pregnancy, second trimester: Secondary | ICD-10-CM | POA: Diagnosis not present

## 2020-01-24 DIAGNOSIS — Z3400 Encounter for supervision of normal first pregnancy, unspecified trimester: Secondary | ICD-10-CM | POA: Diagnosis not present

## 2020-02-15 DIAGNOSIS — Z36 Encounter for antenatal screening for chromosomal anomalies: Secondary | ICD-10-CM | POA: Diagnosis not present

## 2020-02-15 DIAGNOSIS — Z3402 Encounter for supervision of normal first pregnancy, second trimester: Secondary | ICD-10-CM | POA: Diagnosis not present

## 2020-03-11 DIAGNOSIS — J069 Acute upper respiratory infection, unspecified: Secondary | ICD-10-CM | POA: Diagnosis not present

## 2020-03-11 DIAGNOSIS — Z20828 Contact with and (suspected) exposure to other viral communicable diseases: Secondary | ICD-10-CM | POA: Diagnosis not present

## 2020-03-11 DIAGNOSIS — O368191 Decreased fetal movements, unspecified trimester, fetus 1: Secondary | ICD-10-CM | POA: Diagnosis not present

## 2020-04-10 DIAGNOSIS — Z3490 Encounter for supervision of normal pregnancy, unspecified, unspecified trimester: Secondary | ICD-10-CM | POA: Diagnosis not present

## 2020-04-30 LAB — OB RESULTS CONSOLE RPR: RPR: NONREACTIVE

## 2020-05-09 DIAGNOSIS — Z23 Encounter for immunization: Secondary | ICD-10-CM | POA: Diagnosis not present

## 2020-06-18 DIAGNOSIS — Z3490 Encounter for supervision of normal pregnancy, unspecified, unspecified trimester: Secondary | ICD-10-CM | POA: Diagnosis not present

## 2020-06-18 LAB — OB RESULTS CONSOLE GBS: GBS: NEGATIVE

## 2020-07-11 ENCOUNTER — Telehealth (HOSPITAL_COMMUNITY): Payer: Self-pay | Admitting: *Deleted

## 2020-07-11 ENCOUNTER — Encounter (HOSPITAL_COMMUNITY): Payer: Self-pay | Admitting: *Deleted

## 2020-07-11 NOTE — Telephone Encounter (Signed)
Preadmission screen  

## 2020-07-12 ENCOUNTER — Encounter (HOSPITAL_COMMUNITY): Payer: Self-pay | Admitting: *Deleted

## 2020-07-12 ENCOUNTER — Telehealth (HOSPITAL_COMMUNITY): Payer: Self-pay | Admitting: *Deleted

## 2020-07-12 NOTE — Telephone Encounter (Signed)
Preadmission screen  

## 2020-07-16 ENCOUNTER — Inpatient Hospital Stay (HOSPITAL_COMMUNITY): Admit: 2020-07-16 | Payer: Self-pay

## 2020-07-19 ENCOUNTER — Other Ambulatory Visit (HOSPITAL_COMMUNITY)
Admission: RE | Admit: 2020-07-19 | Discharge: 2020-07-19 | Disposition: A | Discharge status: Home / Self Care | Payer: BC Managed Care – PPO | Source: Ambulatory Visit | Attending: Obstetrics and Gynecology | Admitting: Obstetrics and Gynecology

## 2020-07-19 DIAGNOSIS — O480 Post-term pregnancy: Secondary | ICD-10-CM | POA: Diagnosis not present

## 2020-07-19 DIAGNOSIS — Z20822 Contact with and (suspected) exposure to covid-19: Secondary | ICD-10-CM | POA: Diagnosis not present

## 2020-07-19 DIAGNOSIS — O324XX0 Maternal care for high head at term, not applicable or unspecified: Secondary | ICD-10-CM | POA: Diagnosis not present

## 2020-07-19 DIAGNOSIS — Z01812 Encounter for preprocedural laboratory examination: Secondary | ICD-10-CM | POA: Insufficient documentation

## 2020-07-19 DIAGNOSIS — Z3A40 40 weeks gestation of pregnancy: Secondary | ICD-10-CM | POA: Diagnosis not present

## 2020-07-19 DIAGNOSIS — O26893 Other specified pregnancy related conditions, third trimester: Secondary | ICD-10-CM | POA: Diagnosis not present

## 2020-07-20 LAB — SARS CORONAVIRUS 2 (TAT 6-24 HRS): SARS Coronavirus 2: NEGATIVE

## 2020-07-21 ENCOUNTER — Other Ambulatory Visit: Payer: Self-pay | Admitting: Obstetrics and Gynecology

## 2020-07-21 NOTE — H&P (Signed)
Julie PopperMichelle Hancock is a 30 y.o. female, G1P0000, [facility] pt of DR Richardson Doppole, IUP at 40.6 weeks, presenting for Late term IOL. Pt endorse + Fm. Denies vaginal leakage. Denies vaginal bleeding. Denies feeling cxt's.   Patient Active Problem List   Diagnosis Date Noted  . Encounter for induction of labor 07/22/2020  . Allergic conjunctivitis 11/30/2018  . Wheezing 11/30/2018  . Seasonal and perennial allergic rhinitis 07/07/2016  . Epistaxis 07/07/2016     Active Ambulatory Problems    Diagnosis Date Noted  . Seasonal and perennial allergic rhinitis 07/07/2016  . Epistaxis 07/07/2016  . Allergic conjunctivitis 11/30/2018  . Wheezing 11/30/2018   Resolved Ambulatory Problems    Diagnosis Date Noted  . Encounter for surveillance of contraceptive pills 07/22/2017  . Ingrown toenail of left foot 07/22/2017   Past Medical History:  Diagnosis Date  . Allergies   . Medical history non-contributory       Medications Prior to Admission  Medication Sig Dispense Refill Last Dose  . albuterol (VENTOLIN HFA) 108 (90 Base) MCG/ACT inhaler 2 puffs every 4-6 hours if needed for coughing or wheezing spells 18 g 1   . diphenhydrAMINE (BENADRYL) 25 MG tablet Take 25 mg by mouth every 8 (eight) hours as needed.     . Fluticasone Propionate (XHANCE) 93 MCG/ACT EXHU Place 2 puffs into both nostrils 2 (two) times daily. 32 mL 12   . Prenatal Vit-Fe Fumarate-FA (PRENATAL VITAMIN PO) Take by mouth.       Past Medical History:  Diagnosis Date  . Allergies   . Medical history non-contributory      No current facility-administered medications on file prior to encounter.   Current Outpatient Medications on File Prior to Encounter  Medication Sig Dispense Refill  . albuterol (VENTOLIN HFA) 108 (90 Base) MCG/ACT inhaler 2 puffs every 4-6 hours if needed for coughing or wheezing spells 18 g 1  . diphenhydrAMINE (BENADRYL) 25 MG tablet Take 25 mg by mouth every 8 (eight) hours as needed.    .  Fluticasone Propionate (XHANCE) 93 MCG/ACT EXHU Place 2 puffs into both nostrils 2 (two) times daily. 32 mL 12  . Prenatal Vit-Fe Fumarate-FA (PRENATAL VITAMIN PO) Take by mouth.       Allergies  Allergen Reactions  . Latex   . Pollen Extract Itching    History of present pregnancy: Pt Info/Preference:  Screening/Consents:  Labs:   EDD: Estimated Date of Delivery: 07/16/20  Establised: Patient's last menstrual period was 10/10/2019 (exact date).  Anatomy Scan: Date: 02/14/2021 Placenta Location: ???, complete, Left EIF Genetic Screen: Panoroma:LR female AFP:  First Tri: Quad:  Office: [facility]            Md: Dr Richardson Doppole First PNV: 11/30/2019 Blood Type --/--/O POS (05/16 0010)  Language: english Last PNV: ??? Rhogam    Flu Vaccine:  utd   Antibody NEG (05/16 0010)Negative  TDaP vaccine utd   GTT: Early:  Third Trimester: 122  Feeding Plan: breast BTL: no Rubella: Immune (09/23 0000)  Contraception: ??? VBAC: no RPR: Nonreactive (02/22 0000)   Circumcision: N/A   HBsAg: Negative (09/23 0000)  Pediatrician:  ???   HIV: Non-reactive (09/23 0000)   Prenatal Classes: no Additional US: no GBS:  Negative 4/12 (For PCN allergy, check sensitivities)       Chlamydia: neg    MFM Referral/Consult:  GC: neg  Support Person: husband   PAP: AP at [age]years old???  Pain Management: epidural Neonatologist Referral:  Hgb  Electrophoresis:  ???  Birth Plan: DCC   Hgb NOB: ???   28W: 11.9   OB History    Gravida  1   Para  0   Term  0   Preterm  0   AB  0   Living  0     SAB  0   IAB  0   Ectopic  0   Multiple  0   Live Births  0          Past Medical History:  Diagnosis Date  . Allergies   . Medical history non-contributory    Past Surgical History:  Procedure Laterality Date  . WISDOM TOOTH EXTRACTION     Family History: family history includes Allergic rhinitis in her mother; Cancer in her maternal grandfather; Immunodeficiency in her mother; Osteoporosis in her  mother; Rheum arthritis in her mother. Social History:  reports that she has never smoked. She has never used smokeless tobacco. She reports previous alcohol use. She reports that she does not use drugs.   Prenatal Transfer Tool  Maternal Diabetes: No Genetic Screening: Normal Maternal Ultrasounds/Referrals: Normal and Isolated EIF (echogenic intracardiac focus) Left Fetal Ultrasounds or other Referrals:  None Maternal Substance Abuse:  No Significant Maternal Medications:  None Significant Maternal Lab Results: Group B Strep negative  ROS:  Review of Systems  Constitutional: Negative.   HENT: Negative.   Eyes: Negative.   Respiratory: Negative.   Cardiovascular: Negative.   Gastrointestinal: Negative.   Genitourinary: Negative.   Musculoskeletal: Negative.   Skin: Negative.   Neurological: Negative.   Endo/Heme/Allergies: Negative.      Physical Exam: BP 126/79   Pulse 77   Temp 99.2 F (37.3 C) (Oral)   Resp 18   Ht 5\' 3"  (1.6 m)   Wt 72 kg   LMP 10/10/2019 (Exact Date)   BMI 28.13 kg/m   Physical Exam Vitals and nursing note reviewed. Exam conducted with a chaperone present.  HENT:     Head: Normocephalic.     Nose: Nose normal.     Mouth/Throat:     Mouth: Mucous membranes are moist.  Eyes:     Conjunctiva/sclera: Conjunctivae normal.  Cardiovascular:     Rate and Rhythm: Normal rate and regular rhythm.     Pulses: Normal pulses.     Heart sounds: Normal heart sounds.  Pulmonary:     Effort: Pulmonary effort is normal.     Breath sounds: Normal breath sounds.  Abdominal:     General: Bowel sounds are normal.  Genitourinary:    Comments: Pelvis adequate, uterus gravida.  Musculoskeletal:        General: Normal range of motion.     Cervical back: Normal range of motion and neck supple.  Skin:    General: Skin is warm.     Capillary Refill: Capillary refill takes less than 2 seconds.  Neurological:     General: No focal deficit present.     Mental  Status: She is alert.  Psychiatric:        Mood and Affect: Mood normal.      NST: FHR baseline 135 bpm, Variability: moderate, Accelerations:present, Decelerations:  Absent= Cat 1/Reactive UC:   UI/Occ SVE:   Dilation: 1.5 Effacement (%): 70 Station: -2 Exam by:: [email or web address], RN, vertex verified by fetal sutures.  Leopold's: Position vertex, EFW 6.5lbs via leopold's.   Labs: Results for orders placed or performed during the hospital encounter of 07/22/20 (from the past 24  hour(s))  CBC     Status: None   Collection Time: 07/22/20 12:03 AM  Result Value Ref Range   WBC 8.4 4.0 - 10.5 K/uL   RBC 3.93 3.87 - 5.11 MIL/uL   Hemoglobin 12.9 12.0 - 15.0 g/dL   HCT 63.8 75.6 - 43.3 %   MCV 98.0 80.0 - 100.0 fL   MCH 32.8 26.0 - 34.0 pg   MCHC 33.5 30.0 - 36.0 g/dL   RDW 29.5 18.8 - 41.6 %   Platelets 165 150 - 400 K/uL   nRBC 0.0 0.0 - 0.2 %  Type and screen [facility]     Status: None   Collection Time: 07/22/20 12:10 AM  Result Value Ref Range   ABO/RH(D) O POS    Antibody Screen NEG    Sample Expiration      07/25/2020,2359 Performed at [facility], [address] [place], [place] [address]     Imaging:  No results found.  MAU Course: Orders Placed This Encounter  Procedures  . CBC  . RPR  . Diet clear liquid Room service appropriate? Yes; Fluid consistency: Thin  . Vitals signs per unit policy  . Notify Physician  . Fetal monitoring per unit policy  . Activity as tolerated  . Cervical Exam  . Measure blood pressure post delivery every 15 min x 1 hour then every 30 min x 1 hour  . Fundal check post delivery every 15 min x 1 hour then every 30 min x 1 hour  . If Rapid HIV test positive or known HIV positive: initiate AZT orders  . May in and out cath x 2 for inability to void  . Discontinue foley prior to vaginal delivery  . Initiate Carrier Fluid Protocol  . Initiate Oral Care Protocol  . Order Rapid HIV per protocol if no results on  chart  . Patient may have epidural placement upon request  . Evaluate fetal heart rate to establish reassuring pattern prior to initiating Cytotec or Pitocin  . Perform a cervical exam prior to initiating Cytotec or Pitocin  . Discontinue Pitocin if tachysystole with non-reassuring FHR is present  . Nofify MD/CNM if tachysystole with non-reassuring FHR is present  . Initiate intrauterine resuscitation if tachysystole with non-reasuring FHR is present  . If tachysystole WITH reassuring FHR present notify MD / CNM  . May administer Terbutaline 0.25 mg SQ x 1 dose if tachysystole with non-reassuring FHR is presesnt  . Labor Induction  . Full code  . Type and screen [facility]  . Insert and maintain IV Line  . Admit to Inpatient (patient's expected length of stay will be greater than 2 midnights or inpatient only procedure)   Meds ordered this encounter  Medications  . lactated ringers infusion  . oxytocin (PITOCIN) IV BOLUS FROM BAG  . oxytocin (PITOCIN) IV infusion 30 units in NS 500 mL - Premix  . lactated ringers infusion 500-1,000 mL  . acetaminophen (TYLENOL) tablet 650 mg  . oxyCODONE-acetaminophen (PERCOCET/ROXICET) 5-325 MG per tablet 1 tablet  . oxyCODONE-acetaminophen (PERCOCET/ROXICET) 5-325 MG per tablet 2 tablet  . ondansetron (ZOFRAN) injection 4 mg  . sodium citrate-citric acid (ORACIT) solution 30 mL  . lidocaine (PF) (XYLOCAINE) 1 % injection 30 mL  . fentaNYL (SUBLIMAZE) injection 50-100 mcg  . terbutaline (BRETHINE) injection 0.25 mg  . misoprostol (CYTOTEC) tablet 25 mcg    Assessment/Plan: Elisandra Deshmukh is a 30 y.o. female, G1P0000, [facility] pt  of DR Richardson Dopp, IUP at 40.6 weeks, presenting for Late term IOL. Pt endorse + Fm. Denies vaginal leakage. Denies vaginal bleeding. Denies feeling cxt's.    FWB: Cat 1 Fetal Tracing.   Plan: Admit to [facility] per consult with Dr Richardson Dopp Routine [facility] orders Pain med/epidural prn Cytotex for  cervical ripening.  Anticipate labor progression   DR Richardson Dopp to assume care at 0700.   Dale Hondo NP-C, CNM, MSN 07/22/2020, 1:40 AM

## 2020-07-22 ENCOUNTER — Inpatient Hospital Stay (HOSPITAL_COMMUNITY): Payer: BC Managed Care – PPO | Admitting: Anesthesiology

## 2020-07-22 ENCOUNTER — Other Ambulatory Visit: Payer: Self-pay

## 2020-07-22 ENCOUNTER — Encounter (HOSPITAL_COMMUNITY)
Admission: AD | Disposition: A | Discharge status: Home / Self Care | Payer: Self-pay | Source: Home / Self Care | Attending: Obstetrics and Gynecology

## 2020-07-22 ENCOUNTER — Encounter (HOSPITAL_COMMUNITY): Payer: Self-pay | Admitting: Obstetrics and Gynecology

## 2020-07-22 ENCOUNTER — Inpatient Hospital Stay (HOSPITAL_COMMUNITY): Payer: BC Managed Care – PPO

## 2020-07-22 ENCOUNTER — Inpatient Hospital Stay (HOSPITAL_COMMUNITY)
Admission: AD | Admit: 2020-07-22 | Discharge: 2020-07-25 | DRG: 788 | Disposition: A | Discharge status: Home / Self Care | Payer: BC Managed Care – PPO | Attending: Obstetrics and Gynecology | Admitting: Obstetrics and Gynecology

## 2020-07-22 DIAGNOSIS — O480 Post-term pregnancy: Principal | ICD-10-CM | POA: Diagnosis present

## 2020-07-22 DIAGNOSIS — O324XX0 Maternal care for high head at term, not applicable or unspecified: Secondary | ICD-10-CM | POA: Diagnosis present

## 2020-07-22 DIAGNOSIS — Z3A40 40 weeks gestation of pregnancy: Secondary | ICD-10-CM

## 2020-07-22 DIAGNOSIS — Z20822 Contact with and (suspected) exposure to covid-19: Secondary | ICD-10-CM | POA: Diagnosis present

## 2020-07-22 DIAGNOSIS — Z3490 Encounter for supervision of normal pregnancy, unspecified, unspecified trimester: Secondary | ICD-10-CM | POA: Diagnosis present

## 2020-07-22 DIAGNOSIS — O26893 Other specified pregnancy related conditions, third trimester: Secondary | ICD-10-CM | POA: Diagnosis present

## 2020-07-22 DIAGNOSIS — Z98891 History of uterine scar from previous surgery: Secondary | ICD-10-CM

## 2020-07-22 LAB — CBC
HCT: 38.5 % (ref 36.0–46.0)
Hemoglobin: 12.9 g/dL (ref 12.0–15.0)
MCH: 32.8 pg (ref 26.0–34.0)
MCHC: 33.5 g/dL (ref 30.0–36.0)
MCV: 98 fL (ref 80.0–100.0)
Platelets: 165 10*3/uL (ref 150–400)
RBC: 3.93 MIL/uL (ref 3.87–5.11)
RDW: 13.6 % (ref 11.5–15.5)
WBC: 8.4 10*3/uL (ref 4.0–10.5)
nRBC: 0 % (ref 0.0–0.2)

## 2020-07-22 LAB — TYPE AND SCREEN
ABO/RH(D): O POS
Antibody Screen: NEGATIVE

## 2020-07-22 LAB — RPR: RPR Ser Ql: NONREACTIVE

## 2020-07-22 SURGERY — Surgical Case
Anesthesia: Epidural

## 2020-07-22 MED ORDER — SENNOSIDES-DOCUSATE SODIUM 8.6-50 MG PO TABS
2.0000 | ORAL_TABLET | Freq: Every day | ORAL | Status: DC
  Administered 2020-07-23 – 2020-07-25 (×3): 2 via ORAL
  Filled 2020-07-22 (×3): qty 2

## 2020-07-22 MED ORDER — CEFAZOLIN SODIUM-DEXTROSE 2-4 GM/100ML-% IV SOLN
INTRAVENOUS | Status: AC
  Filled 2020-07-22: qty 100

## 2020-07-22 MED ORDER — FENTANYL CITRATE (PF) 100 MCG/2ML IJ SOLN: 25.0000 ug | INTRAMUSCULAR | Status: DC | PRN

## 2020-07-22 MED ORDER — KETOROLAC TROMETHAMINE 30 MG/ML IJ SOLN: 30.0000 mg | Freq: Once | INTRAMUSCULAR | Status: DC

## 2020-07-22 MED ORDER — SODIUM BICARBONATE 8.4 % IV SOLN
INTRAVENOUS | Status: DC | PRN
  Administered 2020-07-22 (×2): 5 mL via EPIDURAL

## 2020-07-22 MED ORDER — METHYLERGONOVINE MALEATE 0.2 MG/ML IJ SOLN
INTRAMUSCULAR | Status: AC
  Filled 2020-07-22: qty 1

## 2020-07-22 MED ORDER — LACTATED RINGERS IV SOLN
500.0000 mL | INTRAVENOUS | Status: DC | PRN
  Administered 2020-07-22: 500 mL via INTRAVENOUS

## 2020-07-22 MED ORDER — LIDOCAINE HCL (PF) 1 % IJ SOLN: 30.0000 mL | INTRAMUSCULAR | Status: DC | PRN

## 2020-07-22 MED ORDER — ONDANSETRON HCL 4 MG/2ML IJ SOLN: 4.0000 mg | Freq: Four times a day (QID) | INTRAMUSCULAR | Status: DC | PRN

## 2020-07-22 MED ORDER — MENTHOL 3 MG MT LOZG: 1.0000 | LOZENGE | OROMUCOSAL | Status: DC | PRN

## 2020-07-22 MED ORDER — ACETAMINOPHEN 10 MG/ML IV SOLN
INTRAVENOUS | Status: AC
  Filled 2020-07-22: qty 100

## 2020-07-22 MED ORDER — FENTANYL-BUPIVACAINE-NACL 0.5-0.125-0.9 MG/250ML-% EP SOLN
12.0000 mL/h | EPIDURAL | Status: DC | PRN
  Administered 2020-07-22: 12 mL/h via EPIDURAL
  Filled 2020-07-22: qty 250

## 2020-07-22 MED ORDER — ONDANSETRON HCL 4 MG/2ML IJ SOLN
INTRAMUSCULAR | Status: DC | PRN
  Administered 2020-07-22: 4 mg via INTRAVENOUS

## 2020-07-22 MED ORDER — MEPERIDINE HCL 25 MG/ML IJ SOLN
6.2500 mg | INTRAMUSCULAR | Status: DC | PRN
Start: 2020-07-22 — End: 2020-07-22

## 2020-07-22 MED ORDER — CEFAZOLIN SODIUM-DEXTROSE 2-4 GM/100ML-% IV SOLN
2.0000 g | INTRAVENOUS | Status: AC
  Administered 2020-07-22: 2 g via INTRAVENOUS

## 2020-07-22 MED ORDER — OXYTOCIN-SODIUM CHLORIDE 30-0.9 UT/500ML-% IV SOLN
1.0000 m[IU]/min | INTRAVENOUS | Status: DC
  Administered 2020-07-22: 4 m[IU]/min via INTRAVENOUS
  Administered 2020-07-22: 2 m[IU]/min via INTRAVENOUS

## 2020-07-22 MED ORDER — ONDANSETRON HCL 4 MG/2ML IJ SOLN
INTRAMUSCULAR | Status: AC
  Filled 2020-07-22: qty 2

## 2020-07-22 MED ORDER — ACETAMINOPHEN 10 MG/ML IV SOLN
1000.0000 mg | Freq: Once | INTRAVENOUS | Status: DC | PRN
  Administered 2020-07-22: 1000 mg via INTRAVENOUS

## 2020-07-22 MED ORDER — KETOROLAC TROMETHAMINE 30 MG/ML IJ SOLN
30.0000 mg | Freq: Once | INTRAMUSCULAR | Status: AC
  Administered 2020-07-22: 30 mg via INTRAVENOUS

## 2020-07-22 MED ORDER — OXYCODONE HCL 5 MG PO TABS
5.0000 mg | ORAL_TABLET | Freq: Once | ORAL | Status: DC | PRN
Start: 2020-07-22 — End: 2020-07-22

## 2020-07-22 MED ORDER — LACTATED RINGERS IV SOLN: INTRAVENOUS | Status: DC

## 2020-07-22 MED ORDER — ACETAMINOPHEN 500 MG PO TABS
1000.0000 mg | ORAL_TABLET | Freq: Four times a day (QID) | ORAL | Status: DC
  Administered 2020-07-23 – 2020-07-25 (×9): 1000 mg via ORAL
  Filled 2020-07-22 (×11): qty 2

## 2020-07-22 MED ORDER — FERROUS SULFATE 325 (65 FE) MG PO TABS
325.0000 mg | ORAL_TABLET | Freq: Two times a day (BID) | ORAL | Status: DC
  Administered 2020-07-23 – 2020-07-25 (×5): 325 mg via ORAL
  Filled 2020-07-22 (×5): qty 1

## 2020-07-22 MED ORDER — TERBUTALINE SULFATE 1 MG/ML IJ SOLN: 0.2500 mg | Freq: Once | INTRAMUSCULAR | Status: DC | PRN

## 2020-07-22 MED ORDER — FENTANYL CITRATE (PF) 100 MCG/2ML IJ SOLN
50.0000 ug | INTRAMUSCULAR | Status: DC | PRN
  Administered 2020-07-22 (×2): 100 ug via INTRAVENOUS
  Filled 2020-07-22 (×2): qty 2

## 2020-07-22 MED ORDER — KETOROLAC TROMETHAMINE 30 MG/ML IJ SOLN
INTRAMUSCULAR | Status: AC
  Filled 2020-07-22: qty 1

## 2020-07-22 MED ORDER — PHENYLEPHRINE 40 MCG/ML (10ML) SYRINGE FOR IV PUSH (FOR BLOOD PRESSURE SUPPORT)
PREFILLED_SYRINGE | INTRAVENOUS | Status: DC | PRN
  Administered 2020-07-22 (×3): 80 ug via INTRAVENOUS

## 2020-07-22 MED ORDER — COCONUT OIL OIL: 1.0000 "application " | TOPICAL_OIL | Status: DC | PRN

## 2020-07-22 MED ORDER — EPHEDRINE 5 MG/ML INJ: 10.0000 mg | INTRAVENOUS | Status: DC | PRN

## 2020-07-22 MED ORDER — AMISULPRIDE (ANTIEMETIC) 5 MG/2ML IV SOLN
10.0000 mg | Freq: Once | INTRAVENOUS | Status: DC | PRN
Start: 2020-07-22 — End: 2020-07-22

## 2020-07-22 MED ORDER — SIMETHICONE 80 MG PO CHEW: 80.0000 mg | CHEWABLE_TABLET | ORAL | Status: DC | PRN

## 2020-07-22 MED ORDER — PRENATAL MULTIVITAMIN CH
1.0000 | ORAL_TABLET | Freq: Every day | ORAL | Status: DC
  Administered 2020-07-23 – 2020-07-25 (×3): 1 via ORAL
  Filled 2020-07-22 (×3): qty 1

## 2020-07-22 MED ORDER — OXYTOCIN-SODIUM CHLORIDE 30-0.9 UT/500ML-% IV SOLN
INTRAVENOUS | Status: DC | PRN
  Administered 2020-07-22: 300 mL via INTRAVENOUS

## 2020-07-22 MED ORDER — OXYTOCIN-SODIUM CHLORIDE 30-0.9 UT/500ML-% IV SOLN
INTRAVENOUS | Status: AC
  Filled 2020-07-22: qty 500

## 2020-07-22 MED ORDER — OXYCODONE HCL 5 MG/5ML PO SOLN: 5.0000 mg | Freq: Once | ORAL | Status: DC | PRN

## 2020-07-22 MED ORDER — BUDESONIDE 0.25 MG/2ML IN SUSP
0.2500 mg | Freq: Two times a day (BID) | RESPIRATORY_TRACT | Status: DC
  Filled 2020-07-22: qty 2

## 2020-07-22 MED ORDER — HYDROMORPHONE HCL 1 MG/ML IJ SOLN: 0.2000 mg | INTRAMUSCULAR | Status: DC | PRN

## 2020-07-22 MED ORDER — FLUTICASONE PROPIONATE 93 MCG/ACT NA EXHU: 2.0000 | INHALANT_SUSPENSION | Freq: Two times a day (BID) | NASAL | Status: DC

## 2020-07-22 MED ORDER — MORPHINE SULFATE (PF) 0.5 MG/ML IJ SOLN
INTRAMUSCULAR | Status: AC
  Filled 2020-07-22: qty 10

## 2020-07-22 MED ORDER — OXYCODONE-ACETAMINOPHEN 5-325 MG PO TABS: 1.0000 | ORAL_TABLET | ORAL | Status: DC | PRN

## 2020-07-22 MED ORDER — ACETAMINOPHEN 325 MG PO TABS: 650.0000 mg | ORAL_TABLET | ORAL | Status: DC | PRN

## 2020-07-22 MED ORDER — SOD CITRATE-CITRIC ACID 500-334 MG/5ML PO SOLN
30.0000 mL | ORAL | Status: DC | PRN
  Filled 2020-07-22: qty 15

## 2020-07-22 MED ORDER — OXYCODONE-ACETAMINOPHEN 5-325 MG PO TABS: 2.0000 | ORAL_TABLET | ORAL | Status: DC | PRN

## 2020-07-22 MED ORDER — DIPHENHYDRAMINE HCL 50 MG/ML IJ SOLN: 12.5000 mg | INTRAMUSCULAR | Status: DC | PRN

## 2020-07-22 MED ORDER — MORPHINE SULFATE (PF) 0.5 MG/ML IJ SOLN
INTRAMUSCULAR | Status: DC | PRN
  Administered 2020-07-22: 3 mg via EPIDURAL

## 2020-07-22 MED ORDER — LIDOCAINE HCL (PF) 1 % IJ SOLN
INTRAMUSCULAR | Status: DC | PRN
Start: 2020-07-22 — End: 2020-07-24
  Administered 2020-07-22: 4 mL via EPIDURAL
  Administered 2020-07-22: 5 mL via EPIDURAL

## 2020-07-22 MED ORDER — METHYLERGONOVINE MALEATE 0.2 MG/ML IJ SOLN
0.2000 mg | INTRAMUSCULAR | Status: DC | PRN
Start: 2020-07-22 — End: 2020-07-25

## 2020-07-22 MED ORDER — PHENYLEPHRINE 40 MCG/ML (10ML) SYRINGE FOR IV PUSH (FOR BLOOD PRESSURE SUPPORT): 80.0000 ug | PREFILLED_SYRINGE | INTRAVENOUS | Status: DC | PRN

## 2020-07-22 MED ORDER — SODIUM BICARBONATE 8.4 % IV SOLN
INTRAVENOUS | Status: AC
  Filled 2020-07-22: qty 50

## 2020-07-22 MED ORDER — OXYCODONE HCL 5 MG PO TABS
5.0000 mg | ORAL_TABLET | ORAL | Status: DC | PRN
  Administered 2020-07-23 – 2020-07-25 (×4): 5 mg via ORAL
  Filled 2020-07-22 (×4): qty 1

## 2020-07-22 MED ORDER — SCOPOLAMINE 1 MG/3DAYS TD PT72
1.0000 | MEDICATED_PATCH | Freq: Once | TRANSDERMAL | Status: DC
  Administered 2020-07-22: 1.5 mg via TRANSDERMAL

## 2020-07-22 MED ORDER — DEXAMETHASONE SODIUM PHOSPHATE 10 MG/ML IJ SOLN
INTRAMUSCULAR | Status: DC | PRN
  Administered 2020-07-22: 10 mg via INTRAVENOUS

## 2020-07-22 MED ORDER — SOD CITRATE-CITRIC ACID 500-334 MG/5ML PO SOLN
30.0000 mL | ORAL | Status: AC
  Administered 2020-07-22: 30 mL via ORAL

## 2020-07-22 MED ORDER — OXYTOCIN-SODIUM CHLORIDE 30-0.9 UT/500ML-% IV SOLN: 2.5000 [IU]/h | INTRAVENOUS | Status: AC

## 2020-07-22 MED ORDER — DIBUCAINE (PERIANAL) 1 % EX OINT: 1.0000 "application " | TOPICAL_OINTMENT | CUTANEOUS | Status: DC | PRN

## 2020-07-22 MED ORDER — SIMETHICONE 80 MG PO CHEW
80.0000 mg | CHEWABLE_TABLET | Freq: Three times a day (TID) | ORAL | Status: DC
  Administered 2020-07-23 – 2020-07-25 (×6): 80 mg via ORAL
  Filled 2020-07-22 (×6): qty 1

## 2020-07-22 MED ORDER — ZOLPIDEM TARTRATE 5 MG PO TABS: 5.0000 mg | ORAL_TABLET | Freq: Every evening | ORAL | Status: DC | PRN

## 2020-07-22 MED ORDER — METHYLERGONOVINE MALEATE 0.2 MG PO TABS: 0.2000 mg | ORAL_TABLET | ORAL | Status: DC | PRN

## 2020-07-22 MED ORDER — MISOPROSTOL 25 MCG QUARTER TABLET
25.0000 ug | ORAL_TABLET | ORAL | Status: DC | PRN
  Administered 2020-07-22 (×2): 25 ug via VAGINAL
  Filled 2020-07-22 (×3): qty 1

## 2020-07-22 MED ORDER — DIPHENHYDRAMINE HCL 25 MG PO CAPS: 25.0000 mg | ORAL_CAPSULE | Freq: Four times a day (QID) | ORAL | Status: DC | PRN

## 2020-07-22 MED ORDER — OXYTOCIN BOLUS FROM INFUSION: 333.0000 mL | Freq: Once | INTRAVENOUS | Status: DC

## 2020-07-22 MED ORDER — ALBUTEROL SULFATE (2.5 MG/3ML) 0.083% IN NEBU: 3.0000 mL | INHALATION_SOLUTION | RESPIRATORY_TRACT | Status: DC | PRN

## 2020-07-22 MED ORDER — IBUPROFEN 600 MG PO TABS
600.0000 mg | ORAL_TABLET | Freq: Four times a day (QID) | ORAL | Status: DC
  Administered 2020-07-23 – 2020-07-25 (×10): 600 mg via ORAL
  Filled 2020-07-22 (×10): qty 1

## 2020-07-22 MED ORDER — SODIUM CHLORIDE 0.9 % IR SOLN
Status: DC | PRN
  Administered 2020-07-22 (×3): 1

## 2020-07-22 MED ORDER — SCOPOLAMINE 1 MG/3DAYS TD PT72
MEDICATED_PATCH | TRANSDERMAL | Status: AC
  Filled 2020-07-22: qty 1

## 2020-07-22 MED ORDER — WITCH HAZEL-GLYCERIN EX PADS
1.0000 | MEDICATED_PAD | CUTANEOUS | Status: DC | PRN
Start: 2020-07-22 — End: 2020-07-25

## 2020-07-22 MED ORDER — PROMETHAZINE HCL 25 MG/ML IJ SOLN
6.2500 mg | INTRAMUSCULAR | Status: DC | PRN
Start: 2020-07-22 — End: 2020-07-22

## 2020-07-22 MED ORDER — OXYTOCIN-SODIUM CHLORIDE 30-0.9 UT/500ML-% IV SOLN
2.5000 [IU]/h | INTRAVENOUS | Status: DC
  Filled 2020-07-22: qty 500

## 2020-07-22 MED ORDER — LACTATED RINGERS IV SOLN: 500.0000 mL | Freq: Once | INTRAVENOUS | Status: DC

## 2020-07-22 MED ORDER — METHYLERGONOVINE MALEATE 0.2 MG/ML IJ SOLN
INTRAMUSCULAR | Status: DC | PRN
  Administered 2020-07-22: .2 mg via INTRAMUSCULAR

## 2020-07-22 MED ORDER — PHENYLEPHRINE 40 MCG/ML (10ML) SYRINGE FOR IV PUSH (FOR BLOOD PRESSURE SUPPORT)
80.0000 ug | PREFILLED_SYRINGE | INTRAVENOUS | Status: DC | PRN
  Filled 2020-07-22: qty 10

## 2020-07-22 MED ORDER — CEFAZOLIN SODIUM-DEXTROSE 2-3 GM-%(50ML) IV SOLR
INTRAVENOUS | Status: DC | PRN
  Administered 2020-07-22: 2 g via INTRAVENOUS

## 2020-07-22 SURGICAL SUPPLY — 33 items
BARRIER ADHS 3X4 INTERCEED (GAUZE/BANDAGES/DRESSINGS) ×2 IMPLANT
BENZOIN TINCTURE PRP APPL 2/3 (GAUZE/BANDAGES/DRESSINGS) ×2 IMPLANT
CHLORAPREP W/TINT 26ML (MISCELLANEOUS) ×2 IMPLANT
CLAMP CORD UMBIL (MISCELLANEOUS) IMPLANT
CLOTH BEACON ORANGE TIMEOUT ST (SAFETY) ×2 IMPLANT
DRSG OPSITE POSTOP 4X10 (GAUZE/BANDAGES/DRESSINGS) ×2 IMPLANT
ELECT REM PT RETURN 9FT ADLT (ELECTROSURGICAL) ×2
ELECTRODE REM PT RTRN 9FT ADLT (ELECTROSURGICAL) ×1 IMPLANT
EXTRACTOR VACUUM KIWI (MISCELLANEOUS) IMPLANT
GLOVE BIOGEL M 7.0 STRL (GLOVE) ×4 IMPLANT
GLOVE BIOGEL PI IND STRL 7.0 (GLOVE) ×3 IMPLANT
GLOVE BIOGEL PI INDICATOR 7.0 (GLOVE) ×3
GOWN STRL REUS W/TWL LRG LVL3 (GOWN DISPOSABLE) ×6 IMPLANT
KIT ABG SYR 3ML LUER SLIP (SYRINGE) IMPLANT
NEEDLE HYPO 25X5/8 SAFETYGLIDE (NEEDLE) IMPLANT
NS IRRIG 1000ML POUR BTL (IV SOLUTION) ×2 IMPLANT
PACK C SECTION WH (CUSTOM PROCEDURE TRAY) ×2 IMPLANT
PAD OB MATERNITY 4.3X12.25 (PERSONAL CARE ITEMS) ×2 IMPLANT
PENCIL SMOKE EVAC W/HOLSTER (ELECTROSURGICAL) ×2 IMPLANT
RTRCTR C-SECT PINK 25CM LRG (MISCELLANEOUS) IMPLANT
STRIP CLOSURE SKIN 1/2X4 (GAUZE/BANDAGES/DRESSINGS) ×2 IMPLANT
SUT MNCRL 0 VIOLET CTX 36 (SUTURE) ×2 IMPLANT
SUT MONOCRYL 0 CTX 36 (SUTURE) ×2
SUT PDS AB 0 CT1 27 (SUTURE) ×4 IMPLANT
SUT PLAIN 0 NONE (SUTURE) IMPLANT
SUT VIC AB 2-0 CT1 27 (SUTURE) ×1
SUT VIC AB 2-0 CT1 TAPERPNT 27 (SUTURE) ×1 IMPLANT
SUT VIC AB 3-0 SH 27 (SUTURE)
SUT VIC AB 3-0 SH 27X BRD (SUTURE) IMPLANT
SUT VIC AB 4-0 KS 27 (SUTURE) ×2 IMPLANT
TOWEL OR 17X24 6PK STRL BLUE (TOWEL DISPOSABLE) ×2 IMPLANT
TRAY FOLEY W/BAG SLVR 14FR LF (SET/KITS/TRAYS/PACK) ×2 IMPLANT
WATER STERILE IRR 1000ML POUR (IV SOLUTION) ×2 IMPLANT

## 2020-07-22 NOTE — Progress Notes (Signed)
   Subjective:  patient reports being exhausted from pushing for the last 2 1/2 hours.   Objective: BP 110/72   Pulse 90   Temp 99.6 F (37.6 C) (Oral)   Resp 16   Ht 5\' 3"  (1.6 m)   Wt 72 kg   LMP 10/10/2019 (Exact Date)   SpO2 99%   BMI 28.13 kg/m  No intake/output data recorded. Total I/O In: -  Out: 250 [Urine:250]  FHT:  FHR: 130 bpm, variability: moderate,  accelerations:  Present,  decelerations:  Present variable  UC:   regular, every 2 minutes SVE:   Dilation: 10 Effacement (%): 100 Station: Plus 1 Exam by:: E. Rothermel RN  Labs: Lab Results  Component Value Date   WBC 8.4 07/22/2020   HGB 12.9 07/22/2020   HCT 38.5 07/22/2020   MCV 98.0 07/22/2020   PLT 165 07/22/2020    Assessment / Plan: Arrest of decent  Labor: no descent after 2 1/2 hours of pushing. recommend cesarean section for failure to descent.  Recommend cesarean section. R/B/A of cesarean section discussed with the patient including but not limited to infection, bleeding damage to bowel bladder and baby with the need for further surgery. R/O transfusion HIV/ Hep B&C discussed. Pt voiced understanding and desires to proceed with cesarean section.  07/24/2020 07/22/2020, 6:37 PM

## 2020-07-22 NOTE — Op Note (Signed)
Cesarean Section Procedure Note  Indications: failure to progress: arrest of descent  Pre-operative Diagnosis: 40 week 6 day pregnancy.  Post-operative Diagnosis: same  Surgeon: Gerald Leitz M.D.  Assistants: Leonard Schwartz CNM assisted due to complexity of the anatomy   Anesthesia: Epidural anesthesia  ASA Class: 2   Procedure Details   The patient was seen in the Holding Room. The risks, benefits, complications, treatment options, and expected outcomes were discussed with the patient.  The patient concurred with the proposed plan, giving informed consent.  The site of surgery properly noted/marked. The patient was taken to Operating Room # C, identified as Briona Korpela and the procedure verified as C-Section Delivery. A Time Out was held and the above information confirmed.  After induction of anesthesia, the patient was draped and prepped in the usual sterile manner. A Pfannenstiel incision was made and carried down through the subcutaneous tissue to the fascia. Fascial incision was made and extended transversely. The fascia was separated from the underlying rectus tissue superiorly and inferiorly. The peritoneum was identified and entered. Peritoneal incision was extended longitudinally. The utero-vesical peritoneal reflection was incised transversely and the bladder flap was bluntly freed from the lower uterine segment. A low transverse uterine incision was made. Delivered from cephalic presentation was a  Female with Apgar scores of 9 at one minute and 9 at five minutes. After the umbilical cord was clamped and cut cord blood was obtained for evaluation. The placenta was removed intact and appeared normal. The uterine outline, tubes and ovaries appeared normal. The uterine incision was closed with running locked sutures of 0 monocryl. A second layer of 0 monocryl was used to imbricate the incision. . Hemostasis was observed. Lavage was carried out until clear. Interseed was placed along  the uterine incision. The fascia was then reapproximated with running sutures of 0 pds. The skin was reapproximated with 4-0 vicryl .  Instrument, sponge, and needle counts were correct prior the abdominal closure and at the conclusion of the case.   Findings:  Female infant in cephalic presentation. Normal appearing fallopian tubes and ovaries bilaterally   Estimated Blood Loss:  610 mL         Drains: None         Total IV Fluids:  Per anesthesia ml         Specimens: Placenta to labor and delivery           Implants: None         Complications:  None; patient tolerated the procedure well.         Disposition: PACU - hemodynamically stable.         Condition: stable  Attending Attestation: I performed the procedure.

## 2020-07-22 NOTE — Progress Notes (Signed)
Julie Hancock is a 30 y.o. G1P0000 at [redacted]w[redacted]d by LMP admitted for induction of labor due to Post dates. Due date 07/16/2020.  Subjective: Patient is comfortable with her epidural +Fm no lof bloody show   Objective: BP 108/64   Pulse 87   Temp 98.8 F (37.1 C) (Oral)   Resp 18   Ht 5\' 3"  (1.6 m)   Wt 72 kg   LMP 10/10/2019 (Exact Date)   SpO2 99%   BMI 28.13 kg/m  No intake/output data recorded. No intake/output data recorded.  FHT:  FHR: 120 bpm, variability: moderate,  accelerations:  Present,  decelerations:  Present variable  UC:   regular, every 2 minutes SVE:6/90/-1 AROM minimal fluid IUPC placed  Labs: Lab Results  Component Value Date   WBC 8.4 07/22/2020   HGB 12.9 07/22/2020   HCT 38.5 07/22/2020   MCV 98.0 07/22/2020   PLT 165 07/22/2020    Assessment / Plan: Induction of labor due to postterm,  progressing well on pitocin  Labor: Progressing normally Preeclampsia:  NA Fetal Wellbeing:  Category I Pain Control:  Epidural I/D:  n/a Anticipated MOD:  NSVD  07/24/2020 07/22/2020, 2:08 PM

## 2020-07-22 NOTE — Transfer of Care (Signed)
Immediate Anesthesia Transfer of Care Note  Patient: Julie Hancock  Procedure(s) Performed: CESAREAN SECTION (N/A )  Patient Location: PACU  Anesthesia Type:Spinal  Level of Consciousness: awake  Airway & Oxygen Therapy: Patient Spontanous Breathing  Post-op Assessment: Report given to RN  Post vital signs: Reviewed and stable  Last Vitals:  Vitals Value Taken Time  BP    Temp    Pulse 94 07/22/20 2009  Resp 21 07/22/20 2009  SpO2 100 % 07/22/20 2009  Vitals shown include unvalidated device data.  Last Pain:  Vitals:   07/22/20 1801  TempSrc:   PainSc: 0-No pain         Complications: No complications documented.

## 2020-07-22 NOTE — Anesthesia Preprocedure Evaluation (Addendum)
Anesthesia Evaluation  Patient identified by MRN, date of birth, ID band Patient awake    Reviewed: Allergy & Precautions, NPO status , Patient's Chart, lab work & pertinent test results  History of Anesthesia Complications Negative for: history of anesthetic complications  Airway Mallampati: II       Dental   Pulmonary asthma ,    Pulmonary exam normal        Cardiovascular negative cardio ROS   Rhythm:Regular Rate:Normal     Neuro/Psych negative neurological ROS  negative psych ROS   GI/Hepatic negative GI ROS, Neg liver ROS,   Endo/Other  negative endocrine ROS  Renal/GU negative Renal ROS     Musculoskeletal negative musculoskeletal ROS (+)   Abdominal   Peds  Hematology negative hematology ROS (+)  Plt 165k    Anesthesia Other Findings Covid test negative   Reproductive/Obstetrics (+) Pregnancy                            Anesthesia Physical Anesthesia Plan  ASA: II  Anesthesia Plan: Epidural   Post-op Pain Management:    Induction:   PONV Risk Score and Plan: 2 and Treatment may vary due to age or medical condition  Airway Management Planned: Natural Airway  Additional Equipment: None  Intra-op Plan:   Post-operative Plan:   Informed Consent: I have reviewed the patients History and Physical, chart, labs and discussed the procedure including the risks, benefits and alternatives for the proposed anesthesia with the patient or authorized representative who has indicated his/her understanding and acceptance.       Plan Discussed with: Anesthesiologist  Anesthesia Plan Comments: (Labs reviewed. Platelets acceptable, patient not taking any blood thinning medications. Per RN, FHR tracing reported to be stable enough for sitting procedure. Risks and benefits discussed with patient, including PDPH, backache, epidural hematoma, failed epidural, blood pressure changes,  allergic reaction, and nerve injury. Patient expressed understanding and wished to proceed.)        Anesthesia Quick Evaluation

## 2020-07-22 NOTE — Anesthesia Procedure Notes (Signed)
Epidural Patient location during procedure: OB Start time: 07/22/2020 1:06 PM End time: 07/22/2020 1:10 PM  Staffing Anesthesiologist: Beryle Lathe, MD Performed: anesthesiologist   Preanesthetic Checklist Completed: patient identified, IV checked, risks and benefits discussed, monitors and equipment checked, pre-op evaluation and timeout performed  Epidural Patient position: sitting Prep: DuraPrep Patient monitoring: continuous pulse ox and blood pressure Approach: midline Location: L3-L4 Injection technique: LOR saline  Needle:  Needle type: Tuohy  Needle gauge: 17 G Needle length: 9 cm Needle insertion depth: 5 cm Catheter size: 19 Gauge Catheter at skin depth: 10 cm Test dose: negative and Other (1% lidocaine)  Assessment Events: blood not aspirated  Additional Notes Patient identified. Risks including, but not limited to, bleeding, infection, nerve damage, paralysis, inadequate analgesia, blood pressure changes, nausea, vomiting, allergic reaction, postpartum back pain, itching, and headache were discussed. Patient expressed understanding and wished to proceed. Sterile prep and drape, including hand hygiene, mask, and sterile gloves were used. The patient was positioned and the spine was prepped. The skin was anesthetized with lidocaine. No paraesthesia or other complication noted. The patient did not experience any signs of intravascular injection such as tinnitus or metallic taste in mouth, nor signs of intrathecal spread such as rapid motor block. Please see nursing notes for vital signs. The patient tolerated the procedure well.   Leslye Peer, MDReason for block:procedure for pain

## 2020-07-23 ENCOUNTER — Encounter (HOSPITAL_COMMUNITY): Payer: Self-pay | Admitting: Obstetrics and Gynecology

## 2020-07-23 LAB — CBC
HCT: 34.4 % — ABNORMAL LOW (ref 36.0–46.0)
Hemoglobin: 11.7 g/dL — ABNORMAL LOW (ref 12.0–15.0)
MCH: 33.4 pg (ref 26.0–34.0)
MCHC: 34 g/dL (ref 30.0–36.0)
MCV: 98.3 fL (ref 80.0–100.0)
Platelets: 146 10*3/uL — ABNORMAL LOW (ref 150–400)
RBC: 3.5 MIL/uL — ABNORMAL LOW (ref 3.87–5.11)
RDW: 13.7 % (ref 11.5–15.5)
WBC: 20.5 10*3/uL — ABNORMAL HIGH (ref 4.0–10.5)
nRBC: 0 % (ref 0.0–0.2)

## 2020-07-23 NOTE — Plan of Care (Signed)
  Problem: Education: Goal: Knowledge of General Education information will improve Description: Including pain rating scale, medication(s)/side effects and non-pharmacologic comfort measures Outcome: Completed/Met   Problem: Health Behavior/Discharge Planning: Goal: Ability to manage health-related needs will improve Outcome: Completed/Met   Problem: Clinical Measurements: Goal: Ability to maintain clinical measurements within normal limits will improve Outcome: Completed/Met   Problem: Pain Managment: Goal: General experience of comfort will improve Outcome: Completed/Met

## 2020-07-23 NOTE — Lactation Note (Addendum)
This note was copied from a baby's chart. Lactation Consultation Note  Patient Name: Julie Hancock PIRJJ'O Date: 07/23/2020 Reason for consult: Follow-up assessment;Mother's request;Primapara;1st time breastfeeding;Term Age:30 hours   LC went in to see how things were going with breastfeeding. Mom stated wanted help with latching infant in football given her abdomen is sore following Csection.   Mom also requested a manual pump. LC talked with Mom about pre pumping before latching 5-10 minutes better let down.  LC assessed flange size decreased her from 24 to 21.  Mom stated it was a comfortable fit.   LC assisted infant latching in football on the left breasts. We worked using chin tug to bring out her lower lip and get better depth at the breasts. Infant has thick labial attachment and a high palate. We worked using breast compression to increase flow and note change in swallows at the breast.   All questions answered at the end of the visit.   Maternal Data Has patient been taught Hand Expression?: Yes  Feeding Mother's Current Feeding Choice: Breast Milk  LATCH Score Latch: Grasps breast easily, tongue down, lips flanged, rhythmical sucking.  Audible Swallowing: Spontaneous and intermittent  Type of Nipple: Everted at rest and after stimulation  Comfort (Breast/Nipple): Soft / non-tender  Hold (Positioning): Assistance needed to correctly position infant at breast and maintain latch.  LATCH Score: 9   Lactation Tools Discussed/Used Tools: Pump;Flanges Flange Size: 21 Breast pump type: Manual Pump Education: Setup, frequency, and cleaning;Milk Storage Reason for Pumping: increase stimulation Pumping frequency: pre pump 5-10 minutes before latching.  Interventions Interventions: Breast feeding basics reviewed;Support pillows;Education;Assisted with latch;Position options;Skin to skin;Expressed milk;Breast massage;Hand express;Hand pump;Breast  compression;Adjust position;Pre-pump if needed  Discharge    Consult Status Consult Status: Follow-up Date: 07/24/20 Follow-up type: In-patient    Dayvion Sans  Nicholson-Springer 07/23/2020, 6:57 PM

## 2020-07-23 NOTE — Lactation Note (Signed)
This note was copied from a baby's chart. Lactation Consultation Note  Patient Name: Julie Hancock BZJIR'C Date: 07/23/2020 Reason for consult: Initial assessment;Term;Primapara;1st time breastfeeding Age:30 hours   P1 mother whose infant is now 61 hours old.  This is a term baby at 40+6 weeks.  RN requested latch assistance.  Baby was quiet and awake in father's arms when I arrived.  RN finishing assisting mother with care.  Reviewed breast feeding basics and taught hand expression.  Mother was unable to express colostrum at this time.    Parents took a breast feeding class and mother interested in trying the football hold since she had an unexpected cesarean section and desires to keep baby off of her abdomen as much as possible.  Attempted to latch to the left breast, however, baby not interested.  Reassured parents that this is typical behavior at this age and we will continue to help as needed.  Encouraged lots of STS and hand expression before/after feedings.  Mother verbalized understanding.  Mother will feed 8-12 times/24 hours or sooner if baby shows cues.  Demonstrated burping and swaddling; parents familiar with bulb syringe usage.    Mom made aware of O/P services, breastfeeding support groups, community resources, and our phone # for post-discharge questions.  Mother has a DEBP for home use.     Maternal Data Has patient been taught Hand Expression?: Yes Does the patient have breastfeeding experience prior to this delivery?: No  Feeding Mother's Current Feeding Choice: Breast Milk  LATCH Score                    Lactation Tools Discussed/Used    Interventions Interventions: Education  Discharge Pump: Personal WIC Program: No  Consult Status Consult Status: Follow-up Date: 07/24/20 Follow-up type: In-patient    Julie Hancock R Thayne Cindric 07/23/2020, 6:13 AM

## 2020-07-23 NOTE — Anesthesia Postprocedure Evaluation (Signed)
Anesthesia Post Note  Patient: Julie Hancock  Procedure(s) Performed: CESAREAN SECTION (N/A )     Patient location during evaluation: Mother Baby Anesthesia Type: Epidural Level of consciousness: awake and alert Pain management: pain level controlled Vital Signs Assessment: post-procedure vital signs reviewed and stable Respiratory status: spontaneous breathing, nonlabored ventilation and respiratory function stable Cardiovascular status: stable Postop Assessment: no headache, no backache and epidural receding Anesthetic complications: no   No complications documented.  Last Vitals:  Vitals:   07/23/20 0732 07/23/20 0851  BP:  117/74  Pulse:  70  Resp:  17  Temp:  36.7 C  SpO2: 95% 100%    Last Pain:  Vitals:   07/23/20 0851  TempSrc: Oral  PainSc: 0-No pain   Pain Goal:                Epidural/Spinal Function Cutaneous sensation: Normal sensation (07/23/20 0851), Patient able to flex knees: Yes (07/23/20 0851), Patient able to lift hips off bed: Yes (07/23/20 0851), Back pain beyond tenderness at insertion site: No (07/23/20 0851), Progressively worsening motor and/or sensory loss: No (07/23/20 0851), Bowel and/or bladder incontinence post epidural: No (07/23/20 0851)  Marrion Coy

## 2020-07-23 NOTE — Progress Notes (Signed)
Subjective: Postpartum Day 1: Cesarean Delivery Patient reports tolerating PO, + flatus and no problems voiding.   Pain is well controlled. Feels some pressure   Objective: Vital signs in last 24 hours: Temp:  [98 F (36.7 C)-99.6 F (37.6 C)] 98.4 F (36.9 C) (05/17 1344) Pulse Rate:  [62-100] 75 (05/17 1344) Resp:  [16-23] 17 (05/17 1344) BP: (102-124)/(59-92) 119/78 (05/17 1344) SpO2:  [95 %-100 %] 100 % (05/17 1344)  Physical Exam:  General: alert, cooperative and no distress Lochia: appropriate Uterine Fundus: firm Incision: healing well DVT Evaluation: No evidence of DVT seen on physical exam.  Recent Labs    07/22/20 0003 07/23/20 0519  HGB 12.9 11.7*  HCT 38.5 34.4*    Assessment/Plan: Status post Cesarean section. Doing well postoperatively.  Continue current care Encouraged ambulation  Pain control will order abdominal binder. Continue ibuprofen and tylenol scheduled. Oxycodone ordered for breakthrough pain.  Possible Discharge home 5/18 or 5/19 .  Gerald Leitz 07/23/2020, 2:36 PM

## 2020-07-23 NOTE — Lactation Note (Signed)
This note was copied from a baby's chart. Lactation Consultation Note Attempted to see mom. Mom stated she had not long finished. Mom feels things are going well. Asked mom to call for [facility] assistance tonight for help w/next feeding.  Patient Name: Julie Hancock [id number] Date: 07/23/2020   Age:30 hours  Maternal Data    Feeding    LATCH Score Latch: Grasps breast easily, tongue down, lips flanged, rhythmical sucking.  Audible Swallowing: A few with stimulation  Type of Nipple: Everted at rest and after stimulation  Comfort (Breast/Nipple): Soft / non-tender  Hold (Positioning): Assistance needed to correctly position infant at breast and maintain latch.  LATCH Score: 8   Lactation Tools Discussed/Used    Interventions    Discharge    Consult Status      [name], Diamond Nickel 07/23/2020, 12:10 AM

## 2020-07-24 ENCOUNTER — Encounter (HOSPITAL_COMMUNITY): Payer: Self-pay | Admitting: Obstetrics and Gynecology

## 2020-07-24 MED ORDER — IBUPROFEN 600 MG PO TABS: 600.0000 mg | ORAL_TABLET | Freq: Four times a day (QID) | ORAL | 1 refills | Status: AC | PRN

## 2020-07-24 MED ORDER — OXYCODONE HCL 5 MG PO TABS: 5.0000 mg | ORAL_TABLET | ORAL | 0 refills | Status: DC | PRN

## 2020-07-24 MED ORDER — ACETAMINOPHEN 500 MG PO TABS: 1000.0000 mg | ORAL_TABLET | Freq: Three times a day (TID) | ORAL | 0 refills | Status: DC | PRN

## 2020-07-24 NOTE — Progress Notes (Signed)
Subjective: Postpartum Day 2: Cesarean Delivery Patient reports incisional pain, tolerating PO, + flatus and no problems voiding.    Objective: Vital signs in last 24 hours: Temp:  [98.4 F (36.9 C)] 98.4 F (36.9 C) (05/18 0851) Pulse Rate:  [75-87] 79 (05/18 0851) Resp:  [16-18] 16 (05/18 0851) BP: (100-119)/(66-78) 100/67 (05/18 0616) SpO2:  [95 %-100 %] 95 % (05/18 0616)  Physical Exam:  General: alert, cooperative and no distress Lochia: appropriate Uterine Fundus: firm Incision: healing well DVT Evaluation: No evidence of DVT seen on physical exam.  Recent Labs    07/22/20 0003 07/23/20 0519  HGB 12.9 11.7*  HCT 38.5 34.4*    Assessment/Plan: Status post Cesarean section. Doing well postoperatively.  Continue current care Encouraged ambulation  Pain control Ibuprofen and tylenol scheduled.. encouraged abdominal binder... oxycodone for break through pain.  D/C home tomorrow .  Gerald Leitz 07/24/2020, 1:43 PM

## 2020-07-25 NOTE — Discharge Summary (Signed)
Postpartum Discharge Summary  07/25/20     Patient Name: Julie Hancock DOB: Apr 09, 1990 MRN: [id number]  Date of admission: 07/22/2020 Delivery date:07/22/2020  Delivering provider: Christophe Louis  Date of discharge: 07/25/2020  Admitting diagnosis: Encounter for induction of labor [Z34.90] Intrauterine pregnancy: [redacted]w[redacted]d    Secondary diagnosis:  Active Problems:   Encounter for induction of labor  Additional problems: None    Discharge diagnosis: Term Pregnancy Delivered                                              Post partum procedures:None Augmentation: AROM, Pitocin and Cytotec Complications: None  Hospital course: Induction of Labor With Cesarean Section   30y.o. yo G1P1001 at 433w6das admitted to the hospital 07/22/2020 for induction of labor. Patient had a labor course significant for arrest of dilation. The patient went for cesarean section due to arrest of dilation. Delivery details are as follows: Membrane Rupture Time/Date: 2:05 PM ,07/22/2020   Delivery Method:C-Section, Low Transverse  Details of operation can be found in separate operative Note.  Patient had an uncomplicated postpartum course. She is ambulating, tolerating a regular diet, passing flatus, and urinating well.  Patient is discharged home in stable condition on 07/25/20.      Newborn Data: Birth date:07/22/2020  Birth time:7:13 PM  Gender:Female  Living status:Living  Apgars:9 ,9  Weight:3170 g                                 Magnesium Sulfate received: No BMZ received: No Rhophylac:N/A MMR:N/A T-DaP:Given prenatally Flu: Yes - received Transfusion:No  Physical exam  Vitals:   07/24/20 0851 07/24/20 1616 07/24/20 2140 07/25/20 0550  BP:  106/68 112/71 102/71  Pulse: 79 71 72 79  Resp: 16 18 17 17   Temp: 98.4 F (36.9 C) 98.5 F (36.9 C) 98.5 F (36.9 C) 98.2 F (36.8 C)  TempSrc: Oral Oral Oral Oral  SpO2:  100% 98% 98%  Weight:      Height:       General: alert, cooperative and no  distress  Cardio:  RRR Lungs:  CTAB, no wheezes/rales/rhonchi Lochia: appropriate Uterine Fundus: firm Incision: Dressing is clean, dry, and intact DVT Evaluation: No evidence of DVT seen on physical exam. Labs: Lab Results  Component Value Date   WBC 20.5 (H) 07/23/2020   HGB 11.7 (L) 07/23/2020   HCT 34.4 (L) 07/23/2020   MCV 98.3 07/23/2020   PLT 146 (L) 07/23/2020   No flowsheet data found. Edinburgh Score: Edinburgh Postnatal Depression Scale Screening Tool 07/24/2020  I have been able to laugh and see the funny side of things. 0  I have looked forward with enjoyment to things. 0  I have blamed myself unnecessarily when things went wrong. 1  I have been anxious or worried for no good reason. 1  I have felt scared or panicky for no good reason. 1  Things have been getting on top of me. 0  I have been so unhappy that I have had difficulty sleeping. 0  I have felt sad or miserable. 0  I have been so unhappy that I have been crying. 0  The thought of harming myself has occurred to me. 0  Edinburgh Postnatal Depression Scale Total 3      After visit  meds:  Allergies as of 07/25/2020      Reactions   Latex    Pollen Extract Itching      Medication List    TAKE these medications   acetaminophen 500 MG tablet Commonly known as: TYLENOL Take 2 tablets (1,000 mg total) by mouth every 8 (eight) hours as needed for moderate pain.   albuterol 108 (90 Base) MCG/ACT inhaler Commonly known as: VENTOLIN HFA 2 puffs every 4-6 hours if needed for coughing or wheezing spells   diphenhydrAMINE 25 MG tablet Commonly known as: BENADRYL Take 25 mg by mouth every 8 (eight) hours as needed.   ibuprofen 600 MG tablet Commonly known as: ADVIL Take 1 tablet (600 mg total) by mouth every 6 (six) hours as needed.   oxyCODONE 5 MG immediate release tablet Commonly known as: Oxy IR/ROXICODONE Take 1-2 tablets (5-10 mg total) by mouth every 4 (four) hours as needed for severe pain or  breakthrough pain.   PRENATAL VITAMIN PO Take by mouth.   Xhance 93 MCG/ACT Exhu Generic drug: Fluticasone Propionate Place 2 puffs into both nostrils 2 (two) times daily.        Discharge home in stable condition Infant Feeding: Breast Infant Disposition:home with mother Discharge instruction: per After Visit Summary and Postpartum booklet. Activity: Advance as tolerated. Pelvic rest for 6 weeks.  Diet: routine diet Anticipated Birth Control: None Postpartum Appointment:6 weeks Additional Postpartum F/U: Incision check 2 weeks Future Appointments:No future appointments. Follow up Visit:  Follow-up Information    Christophe Louis, MD. Schedule an appointment as soon as possible for a visit in 2 week(s).   Specialty: Obstetrics and Gynecology Why: please schedule an appointment for incision check with Dr. Landry Mellow in 2 weeks  Contact information: [address] [place] [address] [phone]                   07/25/2020 Drema Dallas, DO

## 2020-09-12 DIAGNOSIS — Z4889 Encounter for other specified surgical aftercare: Secondary | ICD-10-CM | POA: Diagnosis not present

## 2020-09-17 NOTE — Patient Instructions (Addendum)
Seasonal and perennial allergic rhinitis (September 2020 skin testing positive to grass pollen, tree pollen, dust mite, cat, horse, mold #1, mold #2, mold #3, mold #4, and cockroach) Stopped levocetirizine due to making her nose dry.  Not able to use Claritin or Zyrtec due to epistaxis. Continue Xhance 1 to 2 sprays each nostril 1-2 times a day as needed for stuffy nose May use saline nasal rinse as needed for nasal symptoms.  Use this prior to any medicated nasal sprays. Schedule an appointment in 3 weeks to start allergy injections. Information given and consent signed Auvi Q 0.3 mg prescribed and demonstration given  Allergic conjunctivitis Start Pataday (olopatadine) 0.2% 1 drop each eye once a day as needed for itchy watery eyes. This is safe to use while breast feeding  Wheeze May use albuterol 2 puffs every 4-6 hours as needed for cough, wheeze, tightness in chest, or shortness of breath.  Please let us know if this treatment plan is not working well for you. Schedule a follow-up appointment in 3 months

## 2020-09-19 ENCOUNTER — Encounter: Payer: Self-pay | Admitting: Family

## 2020-09-19 ENCOUNTER — Ambulatory Visit: Payer: BC Managed Care – PPO | Admitting: Family

## 2020-09-19 ENCOUNTER — Other Ambulatory Visit: Payer: Self-pay

## 2020-09-19 VITALS — BP 124/84 | HR 91 | Temp 98.0°F | Resp 16 | Ht 63.0 in | Wt 135.0 lb

## 2020-09-19 DIAGNOSIS — H1013 Acute atopic conjunctivitis, bilateral: Secondary | ICD-10-CM | POA: Diagnosis not present

## 2020-09-19 DIAGNOSIS — J3089 Other allergic rhinitis: Secondary | ICD-10-CM

## 2020-09-19 DIAGNOSIS — J4520 Mild intermittent asthma, uncomplicated: Secondary | ICD-10-CM

## 2020-09-19 MED ORDER — XHANCE 93 MCG/ACT NA EXHU: 2.0000 | INHALANT_SUSPENSION | Freq: Two times a day (BID) | NASAL | 5 refills | Status: DC

## 2020-09-19 MED ORDER — EPINEPHRINE 0.3 MG/0.3ML IJ SOAJ: 0.3000 mg | INTRAMUSCULAR | 1 refills | Status: DC | PRN

## 2020-09-19 NOTE — Progress Notes (Signed)
[address] [place] [address] Dept: [phone]  FOLLOW UP NOTE  Patient ID: Julie Hancock, female    DOB: 06-14-1990  Age: 30 y.o. MRN: [id number] Date of Office Visit: 09/19/2020  Assessment  Chief Complaint: Allergic Rhinitis  (Would like to talk about getting allergy shots and a refill on the xhance. When has bad allergy flare she uses an inhaler, but was not diagnosed with asthma. ) and Other (Sometimes has sinus pressure and headaches. Timmothy Sours helps with other allergy symptoms )  HPI Julie Hancock is a 30 year old female who presents today for follow-up of seasonal and perennial allergic rhinitis, allergic conjunctivitis, and wheezing.  She was last seen on March 01, 2019 by Dr. Nunzio Cobbs.  Since her last office visit she has delivered an 8-week old girl.  She is currently breast-feeding and is not pregnant.  Seasonal and perennial allergic rhinitis is reported as moderately controlled with Xhance 1 to 2 sprays each nostril twice a day, Benadryl as needed, and saline nasal spray as needed.  She reports that she stopped taking levocetirizine 5 mg once a day because it made her nose dry.  She has also tried Claritin and Zyrtec in the past and they caused epistaxis.  She reports that if she does not take Timmothy Sours it causes her eyes to be itchy and red and she will have chest tightness and wheezing.  She will also have occasional sinus pressure once a month.  She denies rhinorrhea, nasal congestion, and postnasal drip.  She had 1 sinus infection possibly in October since we last saw her.  Allergic conjunctivitis is reported as not well controlled with no medication at this time.  She reports that she no longer wears contacts and will just wear glasses since she is working from home.  She reports itchy watery eyes. She is currently using cold compresses.  Wheezing is reported as moderately controlled with albuterol as needed.  Reports that about a month ago she went to urgent  care after dusting without a mask.  She reports she began to have itchy red eyes, itchy throat, and it became hard to breathe and she had wheezing.  She was out of albuterol and was given a prescription for albuterol.  She was not given any steroids.  She now denies any coughing, wheezing, tightness in her chest, or shortness of breath.  She reports occasional nocturnal awakenings every now and then if she does not take her Xhance.  She uses her albuterol maybe once every other month.   Drug Allergies:  Allergies  Allergen Reactions   Latex    Pollen Extract Itching    Review of Systems: Review of Systems  Constitutional:  Negative for chills and fever.  HENT:         Reports sinus pressure about once a month and if she is not taking Xhance she will have itchy red eyes, chest tightness and wheezing.  Denies rhinorrhea, nasal congestion and postnasal drip.  Eyes:        Reports itchy watery eyes  Respiratory:  Negative for cough, shortness of breath and wheezing.   Cardiovascular:  Negative for chest pain and palpitations.  Gastrointestinal:        Denies heartburn or reflux symptoms  Genitourinary:  Negative for dysuria.  Skin:  Negative for itching and rash.  Neurological:  Negative for headaches.  Endo/Heme/Allergies:  Positive for environmental allergies.    Physical Exam: BP 124/84   Pulse 91   Temp 98 F (  36.7 C)   Resp 16   Ht 5\' 3"  (1.6 m)   Wt 135 lb (61.2 kg)   LMP 10/10/2019 (Exact Date)   SpO2 96%   BMI 23.91 kg/m    Physical Exam Constitutional:      Appearance: Normal appearance.  HENT:     Head: Normocephalic and atraumatic.     Comments: Pharynx normal, eyes normal, ears normal, nose normal    Right Ear: Tympanic membrane, ear canal and external ear normal.     Left Ear: Tympanic membrane, ear canal and external ear normal.     Nose: Nose normal.     Mouth/Throat:     Mouth: Mucous membranes are moist.     Pharynx: Oropharynx is clear.  Eyes:      Conjunctiva/sclera: Conjunctivae normal.  Cardiovascular:     Rate and Rhythm: Regular rhythm.     Heart sounds: Normal heart sounds.  Pulmonary:     Effort: Pulmonary effort is normal.     Breath sounds: Normal breath sounds.     Comments: Lungs clear to auscultation Musculoskeletal:     Cervical back: Neck supple.  Skin:    General: Skin is warm.  Neurological:     Mental Status: She is alert and oriented to person, place, and time.  Psychiatric:        Mood and Affect: Mood normal.        Behavior: Behavior normal.        Thought Content: Thought content normal.        Judgment: Judgment normal.    Diagnostics: FVC 2.80 L, FEV1 2.26 L.  Predicted FVC 3.10 L, predicted FEV1 2.65 L.  Spirometry indicates normal ventilatory function.  Assessment and Plan: 1. Seasonal and perennial allergic rhinitis   2. Allergic conjunctivitis of both eyes   3. Mild intermittent asthma without complication     No orders of the defined types were placed in this encounter.   Patient Instructions  Seasonal and perennial allergic rhinitis (September 2020 skin testing positive to grass pollen, tree pollen, dust mite, cat, horse, mold #1, mold #2, mold #3, mold #4, and cockroach) Stopped levocetirizine due to making her nose dry.  Not able to use Claritin or Zyrtec due to epistaxis. Continue Xhance 1 to 2 sprays each nostril 1-2 times a day as needed for stuffy nose May use saline nasal rinse as needed for nasal symptoms.  Use this prior to any medicated nasal sprays. Schedule an appointment in 3 weeks to start allergy injections. Information given and consent signed Auvi Q 0.3 mg prescribed and demonstration given  Allergic conjunctivitis Start Pataday (olopatadine) 0.2% 1 drop each eye once a day as needed for itchy watery eyes. This is safe to use while breast feeding  Wheeze May use albuterol 2 puffs every 4-6 hours as needed for cough, wheeze, tightness in chest, or shortness of  breath.  Please let October 2020 know if this treatment plan is not working well for you. Schedule a follow-up appointment in 3 months Return in about 3 months (around 12/20/2020), or if symptoms worsen or fail to improve, for Also schedule an appointment in 3 weeks to start allergy injections.    Thank you for the opportunity to care for this patient.  Please do not hesitate to contact me with questions.  12/22/2020, FNP [facility]

## 2020-09-24 DIAGNOSIS — J3081 Allergic rhinitis due to animal (cat) (dog) hair and dander: Secondary | ICD-10-CM | POA: Diagnosis not present

## 2020-09-24 NOTE — Progress Notes (Signed)
VIALS MADE. EXP 09-24-21 

## 2020-09-25 DIAGNOSIS — J3089 Other allergic rhinitis: Secondary | ICD-10-CM | POA: Diagnosis not present

## 2020-10-10 ENCOUNTER — Other Ambulatory Visit: Payer: Self-pay

## 2020-10-10 ENCOUNTER — Ambulatory Visit (INDEPENDENT_AMBULATORY_CARE_PROVIDER_SITE_OTHER): Payer: BC Managed Care – PPO

## 2020-10-10 DIAGNOSIS — J309 Allergic rhinitis, unspecified: Secondary | ICD-10-CM

## 2020-10-10 NOTE — Progress Notes (Signed)
Immunotherapy   Patient Details  Name: Julie Hancock MRN: [id number] Date of Birth: 1990/09/01  10/10/2020  Julie Hancock started injections for G-T-C-D and M-DM-CR. Patient received 0.05 of both her blue vials with an expiration of 09/24/2021. Patient waited 30 minutes in office with no problems. Following schedule: A  Frequency:1 time per week Epi-Pen:Epi-Pen Available  Consent signed and patient instructions given.   Dub Mikes 10/10/2020, 10:59 AM

## 2020-10-15 ENCOUNTER — Ambulatory Visit (INDEPENDENT_AMBULATORY_CARE_PROVIDER_SITE_OTHER): Payer: BC Managed Care – PPO | Admitting: *Deleted

## 2020-10-15 DIAGNOSIS — J309 Allergic rhinitis, unspecified: Secondary | ICD-10-CM | POA: Diagnosis not present

## 2020-10-24 ENCOUNTER — Ambulatory Visit (INDEPENDENT_AMBULATORY_CARE_PROVIDER_SITE_OTHER): Payer: BC Managed Care – PPO | Admitting: *Deleted

## 2020-10-24 DIAGNOSIS — Z30430 Encounter for insertion of intrauterine contraceptive device: Secondary | ICD-10-CM | POA: Diagnosis not present

## 2020-10-24 DIAGNOSIS — J309 Allergic rhinitis, unspecified: Secondary | ICD-10-CM

## 2020-10-24 DIAGNOSIS — Z3202 Encounter for pregnancy test, result negative: Secondary | ICD-10-CM | POA: Diagnosis not present

## 2020-11-01 ENCOUNTER — Ambulatory Visit (INDEPENDENT_AMBULATORY_CARE_PROVIDER_SITE_OTHER): Payer: BC Managed Care – PPO

## 2020-11-01 DIAGNOSIS — J309 Allergic rhinitis, unspecified: Secondary | ICD-10-CM | POA: Diagnosis not present

## 2020-11-06 ENCOUNTER — Ambulatory Visit (INDEPENDENT_AMBULATORY_CARE_PROVIDER_SITE_OTHER): Payer: BC Managed Care – PPO

## 2020-11-06 DIAGNOSIS — J309 Allergic rhinitis, unspecified: Secondary | ICD-10-CM

## 2020-11-14 ENCOUNTER — Ambulatory Visit (INDEPENDENT_AMBULATORY_CARE_PROVIDER_SITE_OTHER): Payer: BC Managed Care – PPO

## 2020-11-14 DIAGNOSIS — J309 Allergic rhinitis, unspecified: Secondary | ICD-10-CM

## 2020-11-21 ENCOUNTER — Ambulatory Visit (INDEPENDENT_AMBULATORY_CARE_PROVIDER_SITE_OTHER): Payer: BC Managed Care – PPO | Admitting: *Deleted

## 2020-11-21 DIAGNOSIS — J309 Allergic rhinitis, unspecified: Secondary | ICD-10-CM | POA: Diagnosis not present

## 2020-11-28 ENCOUNTER — Ambulatory Visit (INDEPENDENT_AMBULATORY_CARE_PROVIDER_SITE_OTHER): Payer: BC Managed Care – PPO | Admitting: *Deleted

## 2020-11-28 DIAGNOSIS — J309 Allergic rhinitis, unspecified: Secondary | ICD-10-CM | POA: Diagnosis not present

## 2020-12-05 ENCOUNTER — Ambulatory Visit (INDEPENDENT_AMBULATORY_CARE_PROVIDER_SITE_OTHER): Payer: BC Managed Care – PPO

## 2020-12-05 DIAGNOSIS — Z30431 Encounter for routine checking of intrauterine contraceptive device: Secondary | ICD-10-CM | POA: Diagnosis not present

## 2020-12-05 DIAGNOSIS — J309 Allergic rhinitis, unspecified: Secondary | ICD-10-CM | POA: Diagnosis not present

## 2020-12-12 ENCOUNTER — Ambulatory Visit (INDEPENDENT_AMBULATORY_CARE_PROVIDER_SITE_OTHER): Payer: BC Managed Care – PPO | Admitting: *Deleted

## 2020-12-12 DIAGNOSIS — J309 Allergic rhinitis, unspecified: Secondary | ICD-10-CM | POA: Diagnosis not present

## 2020-12-13 IMAGING — US US OB < 14 WEEKS - US OB TV
1 series · 15 of 28 positions shown · non-contrast
Comparison: None.

CLINICAL DATA: Inappropriate elevation of beta HCG values in early
pregnancy

EXAM:
OBSTETRIC <14 WK US AND TRANSVAGINAL OB US
TECHNIQUE: Both transabdominal and transvaginal ultrasound examinations were
performed for complete evaluation of the gestation as well as the
maternal uterus, adnexal regions, and pelvic cul-de-sac.
Transvaginal technique was performed to assess early pregnancy.

[Series 1: us ob < 14 weeks - us ob tv · 15 of 50 slices shown]
[im 1/50]
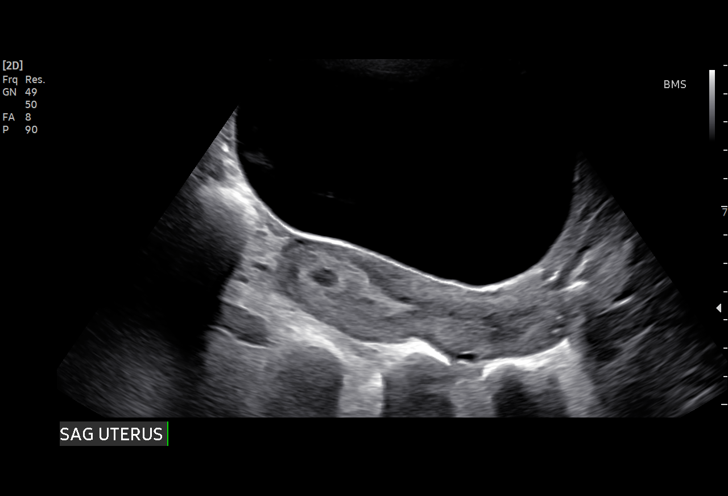
[im 4/50]
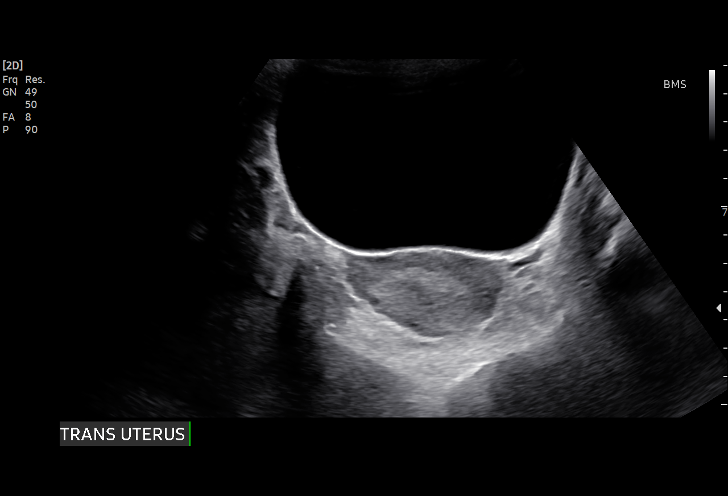
[im 8/50]
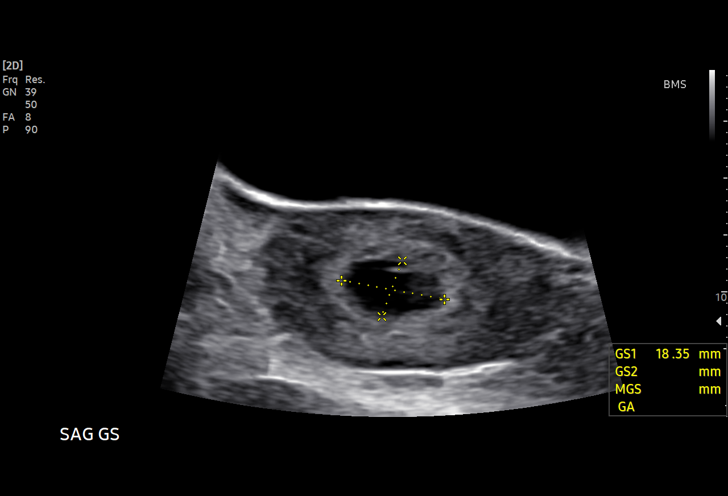
[im 11/50]
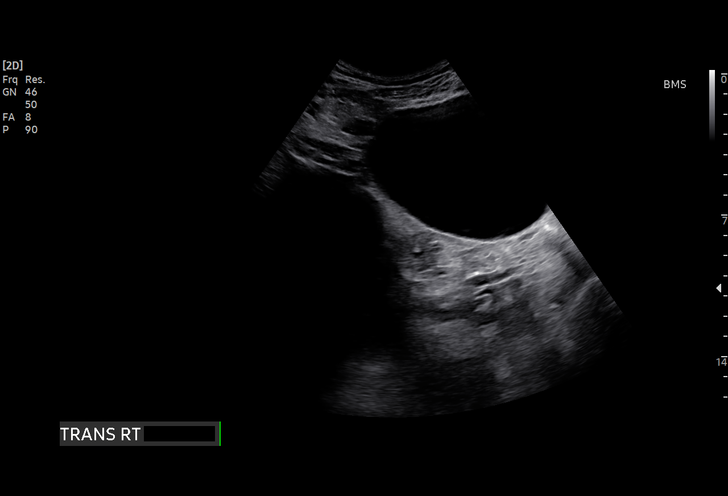
[im 15/50]
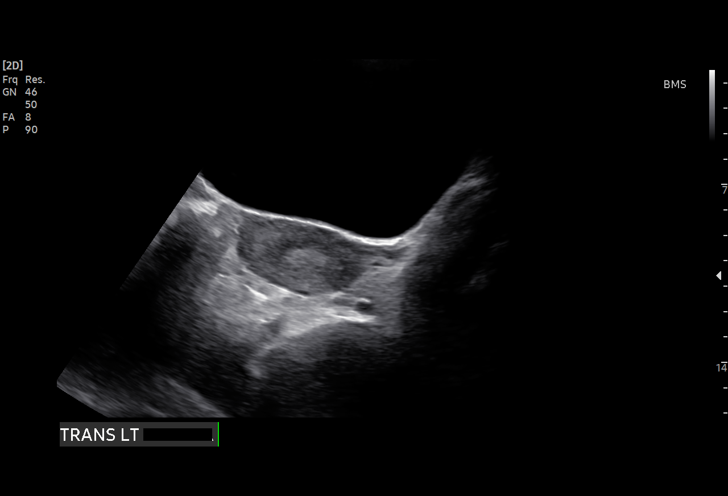
[im 19/50]
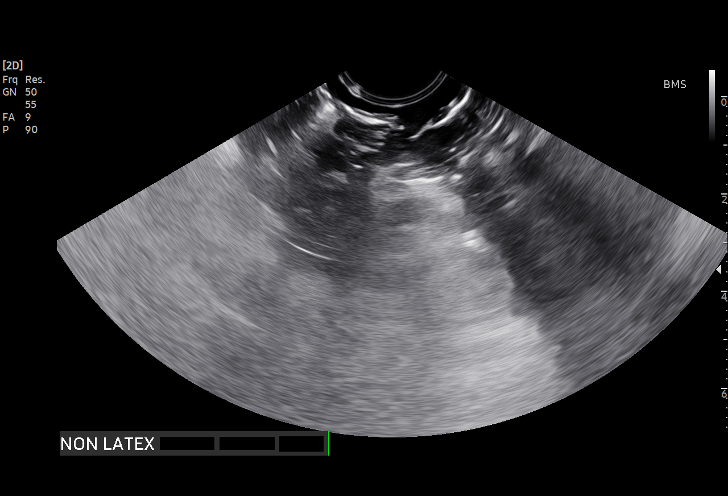
[im 22/50]
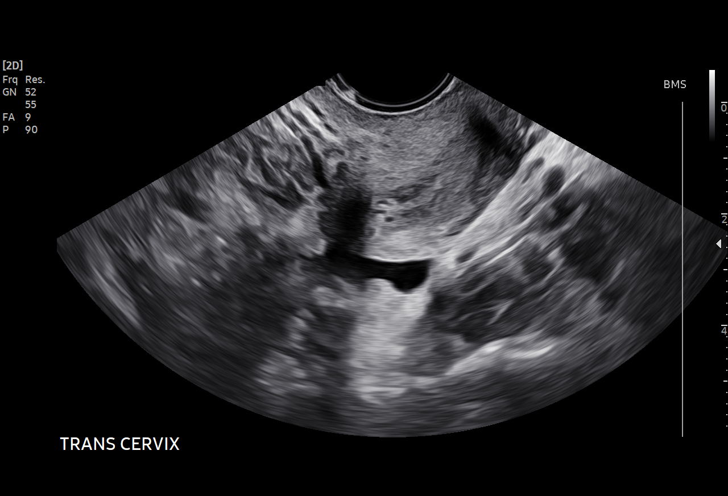
[im 26/50]
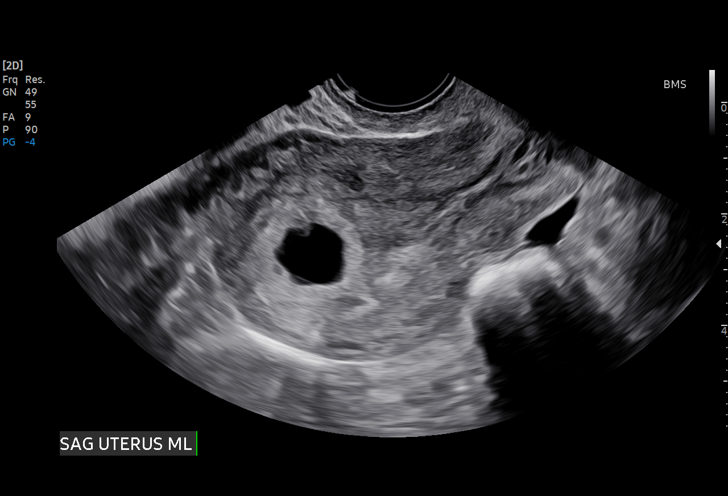
[im 28/50]
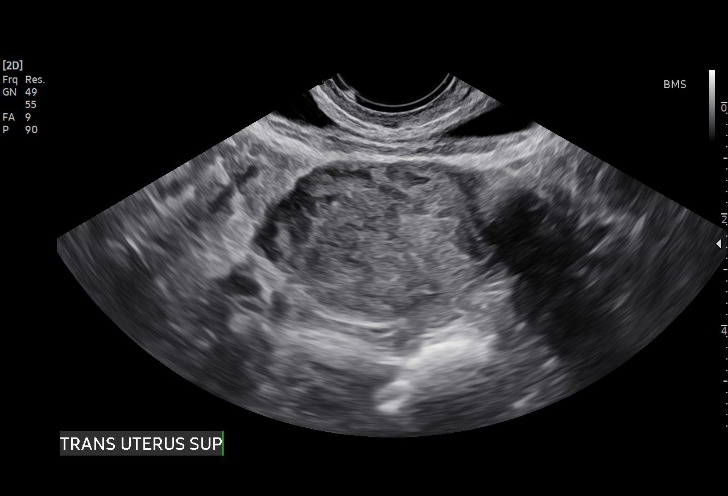
[im 31/50]
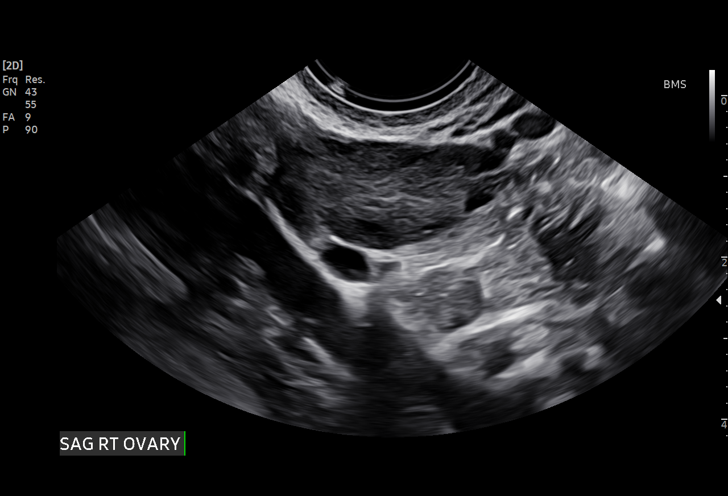
[im 35/50]
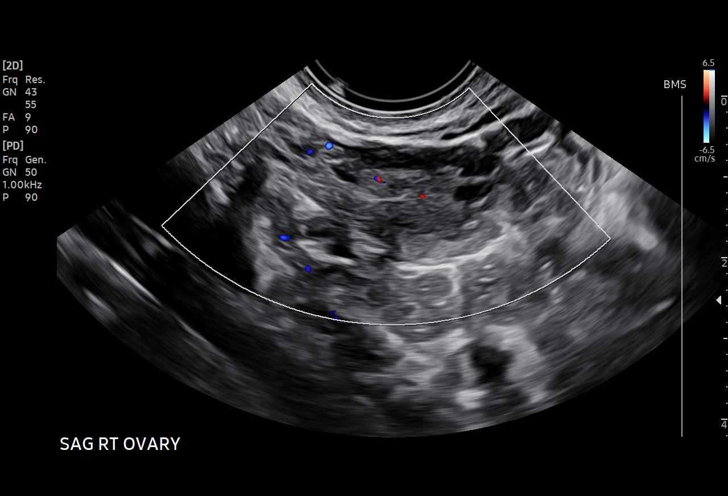
[im 39/50]
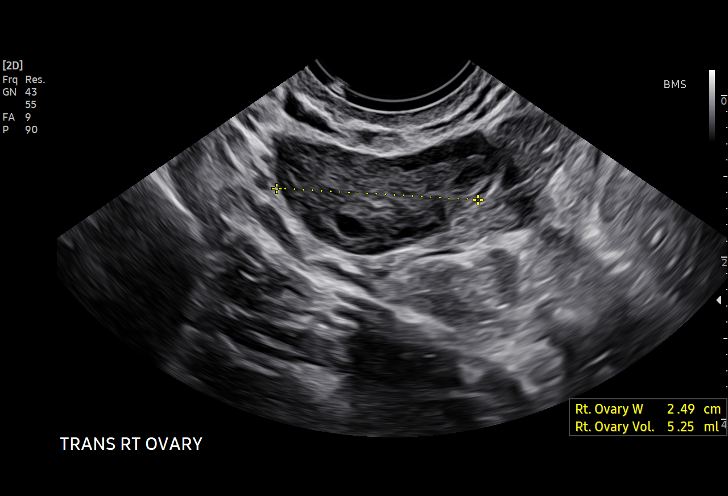
[im 42/50]
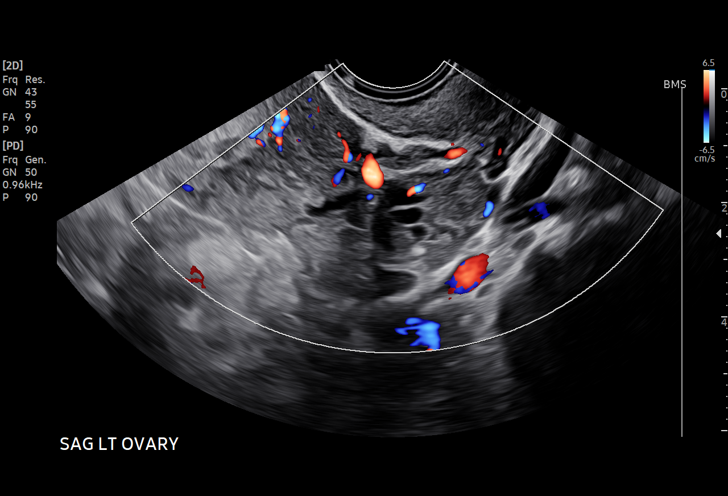
[im 46/50]
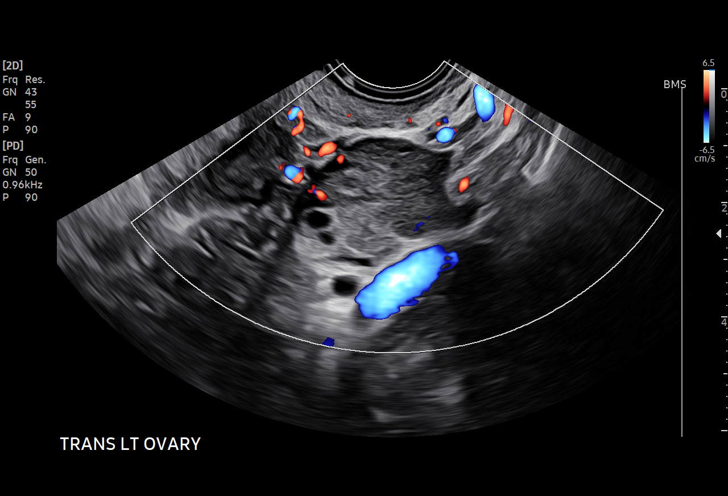
[im 50/50]
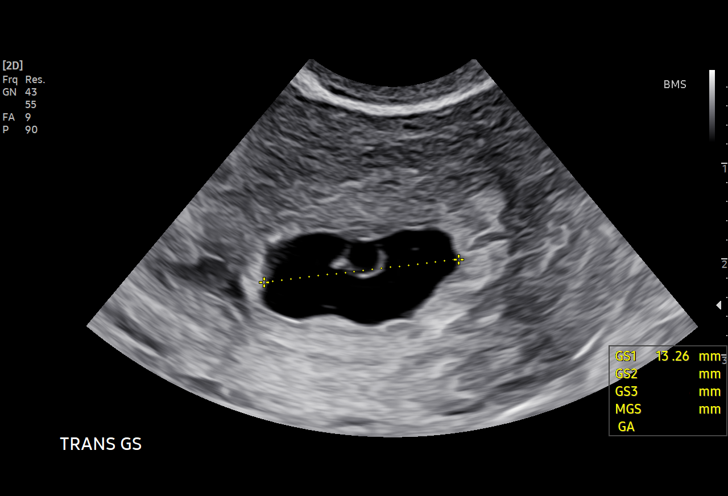

[15 of 28 positions shown; findings below may reference images not displayed]

FINDINGS: Intrauterine gestational sac: Present, single

Yolk sac:  Present, single, normal-appearing

Embryo:  Not visualized

Cardiac Activity: Not applicable

Heart Rate:   Not applicable

MSD: 14.9 mm   6 w   2 d

CRL:    Not applicable

Subchorionic hemorrhage:  None visualized.

Maternal uterus/adnexae: The uterus is anteverted. No intrauterine
masses are seen. The cervix is closed with a cervical length of
roughly 3.5 cm. Small simple appearing free fluid is seen within the
cul-de-sac. The maternal ovaries are normal in size and
echogenicity. No adnexal masses are seen.
IMPRESSION: Intrauterine gestational sac with visualization of the single normal
appearing yolk sac. This roughly estimates the gestational age
between 5.5 and 6.0 weeks. Follow-up sonography in 10-14 days is
recommended to document appropriate progression and better age the
gestation.

## 2020-12-19 ENCOUNTER — Ambulatory Visit (INDEPENDENT_AMBULATORY_CARE_PROVIDER_SITE_OTHER): Payer: BC Managed Care – PPO | Admitting: *Deleted

## 2020-12-19 DIAGNOSIS — J309 Allergic rhinitis, unspecified: Secondary | ICD-10-CM

## 2020-12-22 NOTE — Patient Instructions (Addendum)
Seasonal and perennial allergic rhinitis (September 2020 skin testing positive to grass pollen, tree pollen, dust mite, cat, horse, mold #1, mold #2, mold #3, mold #4, and cockroach) Stopped levocetirizine due to making her nose dry.  Not able to use Claritin or Zyrtec due to epistaxis. Continue Xhance 1 to 2 sprays each nostril 1-2 times a day as needed for stuffy nose May use saline nasal rinse as needed for nasal symptoms.  Use this prior to any medicated nasal sprays. Continue allergy injections per protocol and have access to your epinephrine auto injector device  Allergic conjunctivitis Continue Pataday (olopatadine) 0.2% 1 drop each eye once a day as needed for itchy watery eys  Wheeze May use albuterol 2 puffs every 4-6 hours as needed for cough, wheeze, tightness in chest, or shortness of breath.  Please let us know if this treatment plan is not working well for you. Schedule a follow-up appointment in 6 months or sooner if needed

## 2020-12-23 ENCOUNTER — Other Ambulatory Visit: Payer: Self-pay

## 2020-12-23 ENCOUNTER — Ambulatory Visit: Payer: BC Managed Care – PPO | Admitting: Family

## 2020-12-23 ENCOUNTER — Encounter: Payer: Self-pay | Admitting: Family

## 2020-12-23 VITALS — BP 118/72 | HR 74 | Temp 98.2°F | Resp 16 | Ht 63.0 in | Wt 134.8 lb

## 2020-12-23 DIAGNOSIS — J4520 Mild intermittent asthma, uncomplicated: Secondary | ICD-10-CM

## 2020-12-23 DIAGNOSIS — H1013 Acute atopic conjunctivitis, bilateral: Secondary | ICD-10-CM | POA: Diagnosis not present

## 2020-12-23 DIAGNOSIS — J3089 Other allergic rhinitis: Secondary | ICD-10-CM | POA: Diagnosis not present

## 2020-12-23 NOTE — Progress Notes (Signed)
[address] [place] [address] Dept: [phone]  FOLLOW UP NOTE  Patient ID: Julie Hancock, female    DOB: Apr 05, 1990  Age: 30 y.o. MRN: [id number] Date of Office Visit: 12/23/2020  Assessment  Chief Complaint: Follow-up (Patient states that headaches are better only 1 in past 3 months and out doors does not bother her as much. )  HPI Julie Hancock is a 30 year old female who presents today for follow-up of seasonal and perennial allergic rhinitis, allergic conjunctivitis, and mild intermittent asthma.  She was last seen on September 19, 2020 by Nehemiah Settle, FNP.  Seasonal and perennial allergic rhinitis is reported as moderately controlled with allergy injections per protocol, Benadryl as needed, and Xhance nasal spray once a day.  She reports 1 sinus headache since we last saw her and denies rhinorrhea, nasal congestion, and postnasal drip.  She has not had any sinus infections since we last saw her.  She feels like her allergy injections are helping and her epinephrine autoinjector device is up-to-date.  She denies any large local reactions.  She is not able to tolerate levocetirizine due to it making her nose dry and both Claritin and Zyrtec cause epistaxis.  Allergic conjunctivitis is reported as controlled with Pataday eyedrops as needed.  She reports occasional itchy watery eyes for which she has not needed to use Pataday.  Mild intermittent asthma is reported as controlled.  She denies any coughing, wheezing, tightness in her chest, shortness of breath, and nocturnal awakenings due to breathing problems.  Since her last office visit she has not required any systemic steroids or made any trips to the emergency room or urgent care due to breathing problems.  She has used her albuterol inhaler once since we last saw her.   Drug Allergies:  Allergies  Allergen Reactions   Latex    Pollen Extract Itching    Review of Systems: Review of Systems  Constitutional:   Negative for chills and fever.  HENT:         Denies rhinorrhea, nasal congestion and postnasal drip.  Reports 1 sinus headache since we last saw her  Eyes:        Reports occasional itchy watery eyes for which she has not needed eyedrops  Respiratory:  Negative for cough, shortness of breath and wheezing.   Cardiovascular:  Negative for chest pain and palpitations.  Gastrointestinal:        Denies heartburn or reflux symptoms  Genitourinary:  Negative for frequency.  Skin:  Negative for itching and rash.  Neurological:  Positive for headaches.       Reports 1 sinus headache since her last office visit  Endo/Heme/Allergies:  Positive for environmental allergies.    Physical Exam: BP 118/72   Pulse 74   Temp 98.2 F (36.8 C) (Temporal)   Resp 16   Ht 5\' 3"  (1.6 m)   Wt 134 lb 12.8 oz (61.1 kg)   SpO2 97%   BMI 23.88 kg/m    Physical Exam Constitutional:      Appearance: Normal appearance.  HENT:     Head: Normocephalic and atraumatic.     Comments: Pharynx normal eyes normal, ears normal, nose: Bilateral lower turbinates mildly edematous and slightly erythematous with no drainage noted    Right Ear: Tympanic membrane, ear canal and external ear normal.     Left Ear: Tympanic membrane, ear canal and external ear normal.     Mouth/Throat:     Mouth: Mucous membranes are moist.  Pharynx: Oropharynx is clear.  Eyes:     Conjunctiva/sclera: Conjunctivae normal.  Cardiovascular:     Rate and Rhythm: Normal rate and regular rhythm.     Heart sounds: Normal heart sounds.  Pulmonary:     Effort: Pulmonary effort is normal.     Breath sounds: Normal breath sounds.     Comments: Lungs clear to auscultation Musculoskeletal:     Cervical back: Neck supple.  Skin:    General: Skin is warm.  Neurological:     Mental Status: She is alert and oriented to person, place, and time.  Psychiatric:        Mood and Affect: Mood normal.        Behavior: Behavior normal.         Thought Content: Thought content normal.        Judgment: Judgment normal.    Diagnostics:  Spirometry deferred today. Will do at next office visit  Assessment and Plan: 1. Mild intermittent asthma without complication   2. Seasonal and perennial allergic rhinitis   3. Allergic conjunctivitis of both eyes     No orders of the defined types were placed in this encounter.   Patient Instructions  Seasonal and perennial allergic rhinitis (September 2020 skin testing positive to grass pollen, tree pollen, dust mite, cat, horse, mold #1, mold #2, mold #3, mold #4, and cockroach) Stopped levocetirizine due to making her nose dry.  Not able to use Claritin or Zyrtec due to epistaxis. Continue Xhance 1 to 2 sprays each nostril 1-2 times a day as needed for stuffy nose May use saline nasal rinse as needed for nasal symptoms.  Use this prior to any medicated nasal sprays. Continue allergy injections per protocol and have access to your epinephrine auto injector device  Allergic conjunctivitis Continue Pataday (olopatadine) 0.2% 1 drop each eye once a day as needed for itchy watery eys  Wheeze May use albuterol 2 puffs every 4-6 hours as needed for cough, wheeze, tightness in chest, or shortness of breath.  Please let us know if this treatment plan is not working well for you. Schedule a follow-up appointment in 6 months or sooner if needed Return in about 6 months (around 06/23/2021), or if symptoms worsen or fail to improve.    Thank you for the opportunity to care for this patient.  Please do not hesitate to contact me with questions.  Nehemiah Settle, FNP [facility]

## 2020-12-26 ENCOUNTER — Ambulatory Visit (INDEPENDENT_AMBULATORY_CARE_PROVIDER_SITE_OTHER): Payer: BC Managed Care – PPO | Admitting: *Deleted

## 2020-12-26 DIAGNOSIS — J309 Allergic rhinitis, unspecified: Secondary | ICD-10-CM

## 2021-01-02 ENCOUNTER — Ambulatory Visit (INDEPENDENT_AMBULATORY_CARE_PROVIDER_SITE_OTHER): Payer: BC Managed Care – PPO | Admitting: *Deleted

## 2021-01-02 DIAGNOSIS — J309 Allergic rhinitis, unspecified: Secondary | ICD-10-CM | POA: Diagnosis not present

## 2021-01-09 ENCOUNTER — Ambulatory Visit (INDEPENDENT_AMBULATORY_CARE_PROVIDER_SITE_OTHER): Payer: BC Managed Care – PPO | Admitting: *Deleted

## 2021-01-09 DIAGNOSIS — J309 Allergic rhinitis, unspecified: Secondary | ICD-10-CM

## 2021-01-14 ENCOUNTER — Ambulatory Visit (INDEPENDENT_AMBULATORY_CARE_PROVIDER_SITE_OTHER): Payer: BC Managed Care – PPO | Admitting: *Deleted

## 2021-01-14 DIAGNOSIS — J309 Allergic rhinitis, unspecified: Secondary | ICD-10-CM

## 2021-01-23 ENCOUNTER — Ambulatory Visit (INDEPENDENT_AMBULATORY_CARE_PROVIDER_SITE_OTHER): Payer: BC Managed Care – PPO | Admitting: *Deleted

## 2021-01-23 DIAGNOSIS — J309 Allergic rhinitis, unspecified: Secondary | ICD-10-CM | POA: Diagnosis not present

## 2021-01-29 ENCOUNTER — Ambulatory Visit (INDEPENDENT_AMBULATORY_CARE_PROVIDER_SITE_OTHER): Payer: BC Managed Care – PPO

## 2021-01-29 DIAGNOSIS — J309 Allergic rhinitis, unspecified: Secondary | ICD-10-CM

## 2021-02-06 ENCOUNTER — Ambulatory Visit (INDEPENDENT_AMBULATORY_CARE_PROVIDER_SITE_OTHER): Payer: BC Managed Care – PPO | Admitting: *Deleted

## 2021-02-06 DIAGNOSIS — J309 Allergic rhinitis, unspecified: Secondary | ICD-10-CM

## 2021-02-12 ENCOUNTER — Ambulatory Visit (INDEPENDENT_AMBULATORY_CARE_PROVIDER_SITE_OTHER): Payer: BC Managed Care – PPO

## 2021-02-12 DIAGNOSIS — J309 Allergic rhinitis, unspecified: Secondary | ICD-10-CM

## 2021-03-12 NOTE — Patient Instructions (Addendum)
Seasonal and perennial allergic rhinitis (September 2020 skin testing positive to grass pollen, tree pollen, dust mite, cat, horse, mold #1, mold #2, mold #3, mold #4, and cockroach) Stopped levocetirizine due to making her nose dry.  Not able to use Claritin or Zyrtec due to epistaxis. Start Allegra 180 mg once a day as needed for runny nose/itching.  Samples given Stop Xhance due to no perceived benefit Start saline rinses up to 2 times a day with Pulmicort (budesonide) 0.5 mg twice a day to help with nasal congestion/sinus pressure.  Demonstration given She is wanting to stop allergy injection allergy injections due to joint pain and fatigue.  Form signed to stop injection Start Singulair 10 mg once a day. Patient cautioned that rarely some children/adults can experience behavioral changes after beginning montelukast. These side effects are rare, however, if you notice any change, notify the clinic and discontinue montelukast.  Allergic conjunctivitis Continue Pataday (olopatadine) 0.2% 1 drop each eye once a day as needed for itchy watery eyes.  If you wear contacts make sure to place the Pataday eyedrops 15 to 20 minutes before putting in your contacts  Wheeze May use albuterol 2 puffs every 4-6 hours as needed for cough, wheeze, tightness in chest, or shortness of breath.  Please let us know if this treatment plan is not working well for you. Schedule a follow-up appointment in 3 months or sooner if needed

## 2021-03-13 ENCOUNTER — Other Ambulatory Visit: Payer: Self-pay

## 2021-03-13 ENCOUNTER — Ambulatory Visit (INDEPENDENT_AMBULATORY_CARE_PROVIDER_SITE_OTHER): Payer: BC Managed Care – PPO | Admitting: Family

## 2021-03-13 ENCOUNTER — Encounter: Payer: Self-pay | Admitting: Family

## 2021-03-13 VITALS — BP 112/70 | HR 99 | Temp 98.3°F | Resp 16 | Ht 63.0 in | Wt 129.4 lb

## 2021-03-13 DIAGNOSIS — J3089 Other allergic rhinitis: Secondary | ICD-10-CM

## 2021-03-13 DIAGNOSIS — H1013 Acute atopic conjunctivitis, bilateral: Secondary | ICD-10-CM

## 2021-03-13 DIAGNOSIS — J4520 Mild intermittent asthma, uncomplicated: Secondary | ICD-10-CM

## 2021-03-13 MED ORDER — BUDESONIDE 0.5 MG/2ML IN SUSP: RESPIRATORY_TRACT | 2 refills | Status: DC

## 2021-03-13 MED ORDER — MONTELUKAST SODIUM 10 MG PO TABS: 10.0000 mg | ORAL_TABLET | Freq: Every day | ORAL | 2 refills | Status: DC

## 2021-03-13 NOTE — Progress Notes (Signed)
[address] [place] [id number] Dept: [phone]  FOLLOW UP NOTE  Patient ID: Julie Hancock, female    DOB: 06/06/1990  Age: 31 y.o. MRN: [id number] Date of Office Visit: 03/13/2021  Assessment  Chief Complaint: Asthma (ACT 25) and Allergic Rhinitis  (Can not continue her allergy shots due to it triggering her autoimmune disorder and causing joint pain. Since she has been off her shots her eyes are itchy and the allergies are pretty bad. She needs an alternative to the shots. )  HPI Julie Hancock is a 31 year old female who presents today for discussion of stopping allergy injections.  She was last seen on December 23, 2020 by Nehemiah Settlehristine Anees Vanecek, FNP for mild intermittent asthma, seasonal and perennial allergic rhinitis, and allergic conjunctivitis.  She reports that a couple months ago she began not feeling well.  She had joint pain and fatigue.  She then got sick and could not get her allergy injections for 3 weeks and her joint pain and fatigue felt better.  She spoke with the nurse practitioner at her primary care physician's office who felt that her being predisposed to rheumatoid arthritis that she should stop allergy injections.  She mentions that all the women on her mom's side have rheumatoid arthritis and her mom had juvenile onset rheumatoid arthritis.  She mentions that her allergies have been worse since stopping allergy injections.  She reports increased sinus headaches with pressure, nasal congestion, and occasional sneezing.  She denies rhinorrhea and postnasal nasal drip.  She has not had any sinus infections since we last saw her.  In the past when she has tried levocetirizine it made her nose dry and she is not able to take Claritin or Zyrtec due to epistaxis.  She has not tried Careers adviserAllegra but mentions that her mom did not like it.  She is willing to try Allegra.  She uses Xhance 2 to 3 puffs in the morning and 2 puffs at night and does not get much relief from this.   Allergic conjunctivitis is reported as moderately controlled when she remembers to take her Pataday.  She reports itchy watery eyes.  Mild intermittent asthma is reported as controlled with albuterol as needed.  She denies coughing, wheezing, tightness in chest, shortness of breath, and nocturnal awakenings due to breathing problems.  Since her last office visit she has not required any systemic steroids and has not made any trips to the emergency room or urgent care due to breathing problems.  She has not had to use her albuterol since getting the prescription.   Drug Allergies:  Allergies  Allergen Reactions   Latex    Pollen Extract Itching    Review of Systems: Review of Systems  Constitutional:  Negative for chills and fever.  HENT:         Reports sinus pressure nasal congestion, and occasional sneezing.  Denies rhinorrhea, postnasal drip, and sinus tenderness  Eyes:        Reports itchy watery eyes  Respiratory:  Negative for cough, shortness of breath and wheezing.   Cardiovascular:  Negative for chest pain and palpitations.  Gastrointestinal:        Denies heartburn or reflux symptoms  Genitourinary:  Negative for frequency.  Skin:  Positive for itching. Negative for rash.       Reports she did have itching, but used Aquaphor and this helped  Neurological:  Positive for headaches.       Reports headaches with sinus pressure at times  Endo/Heme/Allergies:  Positive for environmental allergies.    Physical Exam: BP 112/70    Pulse 99    Temp 98.3 F (36.8 C) (Temporal)    Resp 16    Ht 5\' 3"  (1.6 m)    Wt 129 lb 6.4 oz (58.7 kg)    SpO2 97%    BMI 22.92 kg/m    Physical Exam Constitutional:      Appearance: Normal appearance.  HENT:     Head: Normocephalic and atraumatic.     Comments: Pharynx normal, eyes normal, ears normal, nose: Bilateral lower turbinates moderately edematous slightly erythematous with clear drainage noted    Right Ear: Tympanic membrane, ear  canal and external ear normal.     Left Ear: Tympanic membrane, ear canal and external ear normal.     Mouth/Throat:     Mouth: Mucous membranes are moist.     Pharynx: Oropharynx is clear.  Eyes:     Conjunctiva/sclera: Conjunctivae normal.  Cardiovascular:     Rate and Rhythm: Regular rhythm.     Heart sounds: Normal heart sounds.  Pulmonary:     Effort: Pulmonary effort is normal.     Breath sounds: Normal breath sounds.     Comments: Lungs clear to auscultation Musculoskeletal:     Cervical back: Neck supple.  Skin:    General: Skin is warm.  Neurological:     Mental Status: She is alert and oriented to person, place, and time.  Psychiatric:        Mood and Affect: Mood normal.        Behavior: Behavior normal.        Thought Content: Thought content normal.        Judgment: Judgment normal.    Diagnostics: FVC 2.83 L, FEV1 2.16 L.  Predicted FVC 3.37 L, predicted FEV1 2.88 L.  Spirometry indicates normal respiratory function.  Assessment and Plan: 1. Mild intermittent asthma without complication   2. Seasonal and perennial allergic rhinitis   3. Allergic conjunctivitis of both eyes     Meds ordered this encounter  Medications   budesonide (PULMICORT) 0.5 MG/2ML nebulizer solution    Sig: Use one unit dose twice a day as directed    Dispense:  120 mL    Refill:  2   montelukast (SINGULAIR) 10 MG tablet    Sig: Take 1 tablet (10 mg total) by mouth at bedtime.    Dispense:  30 tablet    Refill:  2    Patient Instructions  Seasonal and perennial allergic rhinitis (September 2020 skin testing positive to grass pollen, tree pollen, dust mite, cat, horse, mold #1, mold #2, mold #3, mold #4, and cockroach) Stopped levocetirizine due to making her nose dry.  Not able to use Claritin or Zyrtec due to epistaxis. Start Allegra 180 mg once a day as needed for runny nose/itching.  Samples given Stop Xhance due to no perceived benefit Start saline rinses up to 2 times a day  with Pulmicort (budesonide) 0.5 mg twice a day to help with nasal congestion/sinus pressure.  Demonstration given She is wanting to stop allergy injection allergy injections due to joint pain and fatigue.  Form signed to stop injection Start Singulair 10 mg once a day. Patient cautioned that rarely some children/adults can experience behavioral changes after beginning montelukast. These side effects are rare, however, if you notice any change, notify the clinic and discontinue montelukast.  Allergic conjunctivitis Continue Pataday (olopatadine) 0.2% 1 drop each eye once  a day as needed for itchy watery eyes.  If you wear contacts make sure to place the Pataday eyedrops 15 to 20 minutes before putting in your contacts  Wheeze May use albuterol 2 puffs every 4-6 hours as needed for cough, wheeze, tightness in chest, or shortness of breath.  Please let us know if this treatment plan is not working well for you. Schedule a follow-up appointment in 3 months or sooner if needed  Return in about 3 months (around 06/11/2021), or if symptoms worsen or fail to improve.    Thank you for the opportunity to care for this patient.  Please do not hesitate to contact me with questions.  Nehemiah Settle, FNP [facility]

## 2021-06-11 NOTE — Patient Instructions (Incomplete)
Seasonal and perennial allergic rhinitis (September 2020 skin testing positive to grass pollen, tree pollen, dust mite, cat, horse, mold #1, mold #2, mold #3, mold #4, and cockroach) ?Stopped levocetirizine due to making her nose dry.  Not able to use Claritin or Zyrtec due to epistaxis. ?Stopped Xhance due to no perceived benefit ?Stopped allergy injections due to joint pain and fatigue ?Continue Allegra 180 mg once a day as needed for runny nose/itching.  Samples given ?Continue saline rinses up to 2 times a day with Pulmicort (budesonide) 0.5 mg twice a day to help with nasal congestion/sinus pressure.   ?Continue Singulair 10 mg once a day. ? ? Allergic conjunctivitis ?Continue Pataday (olopatadine) 0.2% 1 drop each eye once a day as needed for itchy watery eyes.  If you wear contacts make sure to place the Pataday eyedrops 15 to 20 minutes before putting in your contacts ? ?Wheeze ?May use albuterol 2 puffs every 4-6 hours as needed for cough, wheeze, tightness in chest, or shortness of breath. ? ?Please let us know if this treatment plan is not working well for you. ?Schedule a follow-up appointment in  months or sooner if needed ?

## 2021-06-14 ENCOUNTER — Other Ambulatory Visit: Payer: Self-pay | Admitting: Family

## 2021-06-14 NOTE — Telephone Encounter (Signed)
Ok to send prescription for montelukast 10 mg once a day. Quantity 30 with 3 refills.

## 2021-06-16 ENCOUNTER — Ambulatory Visit: Payer: BC Managed Care – PPO | Admitting: Family

## 2021-06-17 ENCOUNTER — Encounter: Payer: Self-pay | Admitting: Internal Medicine

## 2021-06-17 ENCOUNTER — Ambulatory Visit: Payer: BC Managed Care – PPO | Admitting: Internal Medicine

## 2021-06-17 VITALS — BP 90/60 | HR 100 | Temp 97.8°F | Resp 18

## 2021-06-17 DIAGNOSIS — H1013 Acute atopic conjunctivitis, bilateral: Secondary | ICD-10-CM

## 2021-06-17 DIAGNOSIS — J3089 Other allergic rhinitis: Secondary | ICD-10-CM

## 2021-06-17 DIAGNOSIS — J4530 Mild persistent asthma, uncomplicated: Secondary | ICD-10-CM

## 2021-06-17 MED ORDER — FEXOFENADINE HCL 180 MG PO TABS: 180.0000 mg | ORAL_TABLET | Freq: Every day | ORAL | 3 refills | Status: DC

## 2021-06-17 NOTE — Patient Instructions (Signed)
Seasonal and perennial rhinitis: well  controlled  ?- Previous testing September 2020 skin testing positive to grass pollen, tree pollen, dust mite, cat, horse, mold #1, mold #2, mold #3, mold #4, and cockroach ?-Continue avoidance measures:  ?- Continue with: Allegra (fexofenadine) 180mg  table once daily and Singulair (montelukast) 10mg  daily ?- You can use an extra dose of the antihistamine, if needed, for breakthrough symptoms.  ?- Consider nasal saline rinses 1-2 times daily to remove allergens from the nasal cavities as well as help with mucous clearance (this is especially helpful to do before the nasal sprays are given) ? ?Mild intermittent asthma: Well-controlled ?- Based on symptoms your asthma is well controlled and we need to continue current therapy  ? ?PLAN: . ?- Daily controller medication(s): Singulair 10mg  daily ?- Prior to physical activity: albuterol 2 puffs 10-15 minutes before physical activity. ?- Rescue medications: albuterol 4 puffs every 4-6 hours as needed ?-  Get Influenza Vaccine and appropriate Pneumonia and COVID 19 boosters  ?- Asthma control goals:  ?* Full participation in all desired activities (may need albuterol before activity) ?* Albuterol use two time or less a week on average (not counting use with activity) ?* Cough interfering with sleep two time or less a month ?* Oral steroids no more than once a year ?* No hospitalizations ? ?Allergic Conjunctivitis ?- Continue Allergen avoidance as instructed ?- Avoiding rubbing eyes, if irritated use a wet wash cloth to wipe allergen out of eyes  ?- Continue Pataday 0.2% eyedrops once a day as needed  ?- Avoid eye drops that say red eye relief as they may contain medications that dry out your eyes. ?- Consider allergen immunotherapy if symptoms worsen or you desire to reduce lifetime use of medications.  ? ?Follow up: 12 months or sooner if problems  ? ?Thank you so much for letting me partake in your care today.  Don't hesitate to reach  out if you have any additional concerns! ? ? , MD  ?[facility] [place] ? ? ? ?

## 2021-06-17 NOTE — Progress Notes (Signed)
? ?Follow Up Note ? ?RE: Julie Hancock MRN: [id number] DOB: [birth year] ?Date of Office Visit: 06/17/2021 ? ?Referring provider: No ref. provider found ?Primary care provider: Patient, No Pcp Per (Inactive) ? ?Chief Complaint: Allergies (Been doing pretty good with the medication) ? ?History of Present Illness: ?I had the pleasure of seeing Julie Hancock for a follow up visit at the [facility] on 06/17/2021. She is a 31 y.o. female, who is being followed for mild intermittent asthma, seasonal and perennial allergic rhinitis, allergic conjunctivitis. Her previous allergy office visit was on 03/13/2021 with Althea Charon FNP. Today is a regular follow up visit.   ? ?Previously she was on AIT however allergy injections were stopped due to concern for rheumatoid arthritis exacerbations being triggered by allergy injections.  At last visit she reported worsening of her allergies since stopping allergy injections and she was started on Allegra 180 mg, Singulair 10 mg daily, budesonide sinus rinses, Pataday 0.2% eyedrops.  She is continued on albuterol as needed.  FEV1 at last visit was 2.83 L ? ?Today she reports improvement in allergies with singulair and allegra.  Denies any side effects.  Mild breakthrough ear itching, but these are tolerable.  Not using budesonide sinus rinses or olopatadine eye drops Since switching to glasses she has not had any ocular symptoms.   ? ?Denies any cough, wheezing, dyspnea.  Denies any OCS or ABX since last visit.  Denies any albuterol use since last visit.   ? ?She does not tolerate levocetirizine due to dryness, Claritin or Zyrtec due to epistaxis.  Previously tried [place] with no perceived benefit. ? ?Assessment and Plan: ?Julie Hancock is a 31 y.o. female with: ?Seasonal and perennial allergic rhinitis ? ?Allergic conjunctivitis of both eyes ? ?Mild persistent asthma without complication ?Plan: ?Patient Instructions  ?Seasonal and perennial rhinitis: well   controlled  ?- Previous testing September 2020 skin testing positive to grass pollen, tree pollen, dust mite, cat, horse, mold #1, mold #2, mold #3, mold #4, and cockroach ?-Continue avoidance measures:  ?- Continue with: Allegra (fexofenadine) 180mg  table once daily and Singulair (montelukast) 10mg  daily ?- You can use an extra dose of the antihistamine, if needed, for breakthrough symptoms.  ?- Consider nasal saline rinses 1-2 times daily to remove allergens from the nasal cavities as well as help with mucous clearance (this is especially helpful to do before the nasal sprays are given) ? ?Mild intermittent asthma: Well-controlled ?- Based on symptoms your asthma is well controlled and we need to continue current therapy  ? ?PLAN: . ?- Daily controller medication(s): Singulair 10mg  daily ?- Prior to physical activity: albuterol 2 puffs 10-15 minutes before physical activity. ?- Rescue medications: albuterol 4 puffs every 4-6 hours as needed ?-  Get Influenza Vaccine and appropriate Pneumonia and COVID 19 boosters  ?- Asthma control goals:  ?* Full participation in all desired activities (may need albuterol before activity) ?* Albuterol use two time or less a week on average (not counting use with activity) ?* Cough interfering with sleep two time or less a month ?* Oral steroids no more than once a year ?* No hospitalizations ? ?Allergic Conjunctivitis ?- Continue Allergen avoidance as instructed ?- Avoiding rubbing eyes, if irritated use a wet wash cloth to wipe allergen out of eyes  ?- Continue Pataday 0.2% eyedrops once a day as needed  ?- Avoid eye drops that say red eye relief as they may contain medications that dry out your eyes. ?- Consider  allergen immunotherapy if symptoms worsen or you desire to reduce lifetime use of medications.  ? ?Follow up: 12 months or sooner if problems  ? ?Thank you so much for letting me partake in your care today.  Don't hesitate to reach out if you have any additional  concerns! ? ?Roney Marion, MD  ?[facility] [place] ? ? ? ?Return in about 1 year (around 06/18/2022). ? ?Meds ordered this encounter  ?Medications  ? fexofenadine (ALLEGRA ALLERGY) 180 MG tablet  ?  Sig: Take 1 tablet (180 mg total) by mouth daily.  ?  Dispense:  90 tablet  ?  Refill:  3  ? ? ?Lab Orders  ?No laboratory test(s) ordered today  ? ?Diagnostics: ?None performed  ? ?Medication List:  ?Current Outpatient Medications  ?Medication Sig Dispense Refill  ? albuterol (VENTOLIN HFA) 108 (90 Base) MCG/ACT inhaler 2 puffs every 4-6 hours if needed for coughing or wheezing spells 18 g 1  ? diphenhydrAMINE (BENADRYL) 25 MG tablet Take 25 mg by mouth every 8 (eight) hours as needed.    ? EPINEPHrine (AUVI-Q) 0.3 mg/0.3 mL IJ SOAJ injection Inject 0.3 mg into the muscle as needed for anaphylaxis. 1 each 1  ? fexofenadine (ALLEGRA ALLERGY) 180 MG tablet Take 1 tablet (180 mg total) by mouth daily. 90 tablet 3  ? ibuprofen (ADVIL) 600 MG tablet Take 1 tablet (600 mg total) by mouth every 6 (six) hours as needed. 30 tablet 1  ? levonorgestrel (KYLEENA) 19.5 MG IUD     ? montelukast (SINGULAIR) 10 MG tablet TAKE 1 TABLET(10 MG) BY MOUTH AT BEDTIME 30 tablet 2  ? Prenatal Vit-Fe Fumarate-FA (PRENATAL VITAMIN PO) Take by mouth.    ? ?No current facility-administered medications for this visit.  ? ?Allergies: ?Allergies  ?Allergen Reactions  ? Latex   ? Pollen Extract Itching  ? ?I reviewed her past medical history, social history, family history, and environmental history and no significant changes have been reported from her previous visit. ? ?ROS: All others negative except as noted per HPI.  ? ?Objective: ?BP 90/60 (BP Location: Left Arm, Patient Position: Sitting, Cuff Size: Normal)   Pulse 100   Temp 97.8 ?F (36.6 ?C) (Temporal)   Resp 18   SpO2 97%  ?There is no height or weight on file to calculate BMI. ?General Appearance:  Alert, cooperative, no distress, appears stated age  ?Head:   Normocephalic, without obvious abnormality, atraumatic  ?Eyes:  Conjunctiva clear, EOM's intact  ?Nose: Nares normal, normal mucosa, no visible anterior polyps, and septum midline  ?Throat: Lips, tongue normal; teeth and gums normal, normal posterior oropharynx and no tonsillar exudate  ?Neck: Supple, symmetrical  ?Lungs:   clear to auscultation bilaterally, Respirations unlabored, no coughing  ?Heart:  regular rate and rhythm and no murmur, Appears well perfused  ?Extremities: No edema  ?Skin: Skin color, texture, turgor normal, no rashes or lesions on visualized portions of skin  ?Neurologic: No gross deficits  ? ?Previous notes and tests were reviewed. ?The plan was reviewed with the patient/family, and all questions/concerned were addressed. ? ?It was my pleasure to see Julie Hancock today and participate in her care. Please feel free to contact me with any questions or concerns. ? ?Sincerely, ? ?Roney Marion, MD  ?Allergy & Immunology ? ?[facility] ?

## 2021-09-17 ENCOUNTER — Other Ambulatory Visit: Payer: Self-pay | Admitting: Family

## 2021-09-17 NOTE — Telephone Encounter (Signed)
Ok to send prescription with 5 refills.

## 2022-03-22 ENCOUNTER — Other Ambulatory Visit: Payer: Self-pay | Admitting: Family

## 2022-07-24 ENCOUNTER — Other Ambulatory Visit: Payer: Self-pay | Admitting: Family

## 2022-07-28 ENCOUNTER — Ambulatory Visit: Payer: BC Managed Care – PPO | Admitting: Internal Medicine

## 2022-07-28 ENCOUNTER — Encounter: Payer: Self-pay | Admitting: Internal Medicine

## 2022-07-28 VITALS — BP 106/66 | HR 93 | Temp 98.1°F | Resp 16 | Wt 137.5 lb

## 2022-07-28 DIAGNOSIS — J3089 Other allergic rhinitis: Secondary | ICD-10-CM | POA: Diagnosis not present

## 2022-07-28 DIAGNOSIS — J4520 Mild intermittent asthma, uncomplicated: Secondary | ICD-10-CM

## 2022-07-28 DIAGNOSIS — H1013 Acute atopic conjunctivitis, bilateral: Secondary | ICD-10-CM | POA: Diagnosis not present

## 2022-07-28 MED ORDER — FLUTICASONE PROPIONATE 50 MCG/ACT NA SUSP: 1.0000 | Freq: Two times a day (BID) | NASAL | 2 refills | Status: DC

## 2022-07-28 MED ORDER — MONTELUKAST SODIUM 10 MG PO TABS
ORAL_TABLET | ORAL | 5 refills | Status: DC
Start: 2022-07-28 — End: 2023-04-30

## 2022-07-28 NOTE — Patient Instructions (Addendum)
Seasonal and perennial rhinitis: increased symptoms during pregnancy  - Previous testing September 2020 skin testing positive to grass pollen, tree pollen, dust mite, cat, horse, mold #1, mold #2, mold #3, mold #4, and cockroach -Continue avoidance measures:  - Continue with: Allegra (fexofenadine) 180mg  table once daily and Singulair (montelukast) 10mg  daily - Add on:  Flonase 1 spray per nostril 1-2 times a day  - You can use an extra dose of the antihistamine, if needed, for breakthrough symptoms.  - Consider nasal saline rinses 1-2 times daily to remove allergens from the nasal cavities as well as help with mucous clearance (this is especially helpful to do before the nasal sprays are given)  Mild intermittent asthma: Well-controlled  PLAN: . - Daily controller medication(s): Singulair 10mg  daily - Prior to physical activity: albuterol 2 puffs 10-15 minutes before physical activity. - Rescue medications: albuterol 4 puffs every 4-6 hours as needed -  Get Influenza Vaccine and appropriate Pneumonia and COVID 19 boosters  - Asthma control goals:  * Full participation in all desired activities (may need albuterol before activity) * Albuterol use two time or less a week on average (not counting use with activity) * Cough interfering with sleep two time or less a month * Oral steroids no more than once a year * No hospitalizations  Allergic Conjunctivitis - Avoiding rubbing eyes, if irritated use a wet wash cloth to wipe allergen out of eyes  - Continue Pataday 0.2% eyedrops once a day as needed  - Avoid eye drops that say red eye relief as they may contain medications that dry out your eyes.  Follow up: 6 months or sooner if problems   Thank you so much for letting me partake in your care today.  Don't hesitate to reach out if you have any additional concerns!  Ferol Luz, MD  [facility] [place]

## 2022-07-28 NOTE — Progress Notes (Signed)
Follow Up Note  RE: Julie Hancock MRN: [id number] DOB: May 17, 1990 Date of Office Visit: 07/28/2022  Referring provider: No ref. provider found Primary care provider: Patient, No Pcp Per  Chief Complaint: Follow-up (Pt states she her asthma is well control and her allergies have been flaring up, she's been stuffy, sinus pressure and post nasal drainage. ), Asthma, and Allergic Rhinitis   History of Present Illness: I had the pleasure of seeing Julie Hancock for a follow up visit at the [facility] on 07/28/2022. She is a 32 y.o. female, who is being followed for mild intermittent asthma, seasonal and perennial allergic rhinitis, allergic conjunctivitis. Her previous allergy office visit was on 06/17/21 with Nehemiah Settle FNP. Today is a regular follow up visit.    Previously she was on AIT however allergy injections were stopped due to concern for rheumatoid arthritis exacerbations being triggered by allergy injections. No recurrence of arthritis symptoms since stopping AIT   Today she reports improvement in allergies with singulair and allegra.  Denies any side effects. Mild increase in nasal congestion since she became pregnant, she is due in December with a baby boy.  No using any nasal sprays or rinses   Denies any cough, wheezing, dyspnea.  Denies any OCS or ABX since last visit.  Denies any albuterol use since last visit.    She does not tolerate levocetirizine due to dryness, Claritin or Zyrtec due to epistaxis.  Previously tried [place] with no perceived benefit.    Assessment and Plan: [name] is a 32 y.o. female with: Mild intermittent asthma without complication  Seasonal and perennial allergic rhinitis  Allergic conjunctivitis of both eyes Plan: Patient Instructions  Seasonal and perennial rhinitis: increased symptoms during pregnancy  - Previous testing September 2020 skin testing positive to grass pollen, tree pollen, dust mite, cat, horse,  mold #1, mold #2, mold #3, mold #4, and cockroach -Continue avoidance measures:  - Continue with: Allegra (fexofenadine) 180mg  table once daily and Singulair (montelukast) 10mg  daily - Add on:  Flonase 1 spray per nostril 1-2 times a day  - You can use an extra dose of the antihistamine, if needed, for breakthrough symptoms.  - Consider nasal saline rinses 1-2 times daily to remove allergens from the nasal cavities as well as help with mucous clearance (this is especially helpful to do before the nasal sprays are given)  Mild intermittent asthma: Well-controlled  PLAN: . - Daily controller medication(s): Singulair 10mg  daily - Prior to physical activity: albuterol 2 puffs 10-15 minutes before physical activity. - Rescue medications: albuterol 4 puffs every 4-6 hours as needed -  Get Influenza Vaccine and appropriate Pneumonia and COVID 19 boosters  - Asthma control goals:  * Full participation in all desired activities (may need albuterol before activity) * Albuterol use two time or less a week on average (not counting use with activity) * Cough interfering with sleep two time or less a month * Oral steroids no more than once a year * No hospitalizations  Allergic Conjunctivitis - Avoiding rubbing eyes, if irritated use a wet wash cloth to wipe allergen out of eyes  - Continue Pataday 0.2% eyedrops once a day as needed  - Avoid eye drops that say red eye relief as they may contain medications that dry out your eyes.  Follow up: 6 months or sooner if problems   Thank you so much for letting me partake in your care today.  Don't hesitate to reach out if  you have any additional concerns!  Ferol Luz, MD  [facility] [place]   No follow-ups on file.  Meds ordered this encounter  Medications   fluticasone (FLONASE) 50 MCG/ACT nasal spray    Sig: Place 1 spray into both nostrils in the morning and at bedtime.    Dispense:  16 g    Refill:  2    montelukast (SINGULAIR) 10 MG tablet    Sig: TAKE 1 TABLET(10 MG) BY MOUTH AT BEDTIME    Dispense:  90 tablet    Refill:  5    Lab Orders  No laboratory test(s) ordered today   Diagnostics: None performed   Medication List:  Current Outpatient Medications  Medication Sig Dispense Refill   albuterol (VENTOLIN HFA) 108 (90 Base) MCG/ACT inhaler 2 puffs every 4-6 hours if needed for coughing or wheezing spells 18 g 1   diphenhydrAMINE (BENADRYL) 25 MG tablet Take 25 mg by mouth every 8 (eight) hours as needed.     EPINEPHrine (AUVI-Q) 0.3 mg/0.3 mL IJ SOAJ injection Inject 0.3 mg into the muscle as needed for anaphylaxis. 1 each 1   fexofenadine (ALLEGRA ALLERGY) 180 MG tablet Take 1 tablet (180 mg total) by mouth daily. 90 tablet 3   fluticasone (FLONASE) 50 MCG/ACT nasal spray Place 1 spray into both nostrils in the morning and at bedtime. 16 g 2   ibuprofen (ADVIL) 600 MG tablet Take 1 tablet (600 mg total) by mouth every 6 (six) hours as needed. 30 tablet 1   levonorgestrel (KYLEENA) 19.5 MG IUD      Prenatal Vit-Fe Fumarate-FA (PRENATAL VITAMIN PO) Take by mouth.     montelukast (SINGULAIR) 10 MG tablet TAKE 1 TABLET(10 MG) BY MOUTH AT BEDTIME 90 tablet 5   No current facility-administered medications for this visit.   Allergies: Allergies  Allergen Reactions   Latex    Pollen Extract Itching   I reviewed her past medical history, social history, family history, and environmental history and no significant changes have been reported from her previous visit.  ROS: All others negative except as noted per HPI.   Objective: BP 106/66   Pulse 93   Temp 98.1 F (36.7 C) (Temporal)   Resp 16   Wt 137 lb 8 oz (62.4 kg)   SpO2 98%   BMI 24.36 kg/m  Body mass index is 24.36 kg/m. General Appearance:  Alert, cooperative, no distress, appears stated age  Head:  Normocephalic, without obvious abnormality, atraumatic  Eyes:  Conjunctiva clear, EOM's intact  Nose: Nares normal,   edematous nasal mucosa with clear rhinnorhea, no visible anterior polyps, and septum midline  Throat: Lips, tongue normal; teeth and gums normal, normal posterior oropharynx and no tonsillar exudate  Neck: Supple, symmetrical  Lungs:   clear to auscultation bilaterally, Respirations unlabored, no coughing  Heart:  regular rate and rhythm and no murmur, Appears well perfused  Extremities: No edema  Skin: Skin color, texture, turgor normal, no rashes or lesions on visualized portions of skin  Neurologic: No gross deficits   Previous notes and tests were reviewed. The plan was reviewed with the patient/family, and all questions/concerned were addressed.  It was my pleasure to see Najai today and participate in her care. Please feel free to contact me with any questions or concerns.  Sincerely,  Ferol Luz, MD  Allergy & Immunology  [facility]

## 2023-02-01 ENCOUNTER — Ambulatory Visit: Payer: BC Managed Care – PPO | Admitting: Internal Medicine

## 2023-02-10 ENCOUNTER — Ambulatory Visit: Payer: BC Managed Care – PPO | Admitting: Internal Medicine

## 2023-04-29 NOTE — Patient Instructions (Incomplete)
 Seasonal and perennial rhinitis: moderately controlled - Previous testing September 2020 skin testing positive to grass pollen, tree pollen, dust mite, cat, horse, mold #1, mold #2, mold #3, mold #4, and cockroach -Continue avoidance measures:  - Continue with: Allegra (fexofenadine) 180mg  table once daily and Singulair (montelukast) 10mg  daily - Continue with:  Flonase 1 spray per nostril 1-2 times a day  - You can use an extra dose of the antihistamine, if needed, for breakthrough symptoms.  - Consider nasal saline rinses 1-2 times daily to remove allergens from the nasal cavities as well as help with mucous clearance (this is especially helpful to do before the nasal sprays are given) -Consider stopping Allegra and trying carbinoxamine once finished breast feeding  Mild intermittent asthma:well controlled Your breathing test looks great today PLAN: . - Daily controller medication(s): Singulair 10mg  daily - Prior to physical activity: albuterol 2 puffs 10-15 minutes before physical activity. - Rescue medications: albuterol 4 puffs every 4-6 hours as needed -  Get Influenza Vaccine and appropriate Pneumonia and COVID 19 boosters  - Asthma control goals:  * Full participation in all desired activities (may need albuterol before activity) * Albuterol use two time or less a week on average (not counting use with activity) * Cough interfering with sleep two time or less a month * Oral steroids no more than once a year * No hospitalizations  Allergic Conjunctivitis:controlled - Avoiding rubbing eyes, if irritated use a wet wash cloth to wipe allergen out of eyes  - Continue Pataday 0.2% eyedrops once a day as needed  - Avoid eye drops that say red eye relief as they may contain medications that dry out your eyes.  Follow up: 6 months or sooner if problems

## 2023-04-30 ENCOUNTER — Other Ambulatory Visit: Payer: Self-pay

## 2023-04-30 ENCOUNTER — Ambulatory Visit (INDEPENDENT_AMBULATORY_CARE_PROVIDER_SITE_OTHER): Payer: No Typology Code available for payment source | Admitting: Family

## 2023-04-30 ENCOUNTER — Encounter: Payer: Self-pay | Admitting: Family

## 2023-04-30 VITALS — BP 114/76 | HR 90 | Temp 98.0°F | Resp 18 | Wt 147.9 lb

## 2023-04-30 DIAGNOSIS — H1013 Acute atopic conjunctivitis, bilateral: Secondary | ICD-10-CM

## 2023-04-30 DIAGNOSIS — J4520 Mild intermittent asthma, uncomplicated: Secondary | ICD-10-CM | POA: Diagnosis not present

## 2023-04-30 DIAGNOSIS — J3089 Other allergic rhinitis: Secondary | ICD-10-CM

## 2023-04-30 MED ORDER — MONTELUKAST SODIUM 10 MG PO TABS: ORAL_TABLET | ORAL | 1 refills | Status: DC

## 2023-04-30 MED ORDER — FLUTICASONE PROPIONATE 50 MCG/ACT NA SUSP: NASAL | 5 refills | Status: DC

## 2023-04-30 MED ORDER — ALBUTEROL SULFATE HFA 108 (90 BASE) MCG/ACT IN AERS: INHALATION_SPRAY | RESPIRATORY_TRACT | 1 refills | Status: DC

## 2023-04-30 NOTE — Progress Notes (Signed)
 [address] [place] [address] Dept: [id number]  FOLLOW UP NOTE  Patient ID: Julie Hancock, female    DOB: 09/17/1990  Age: 33 y.o. MRN: [id number] Date of Office Visit: 04/30/2023  Assessment  Chief Complaint: Medication Management and Follow-up (6 month follow up, doing well. Had baby 5 months ago, breast feeding. She wants to continue with flonase and singulair if safe.)  HPI Julie Hancock is a 33 year old female who presents today for follow-up of mild intermittent asthma, seasonal and perennial allergic rhinitis, and allergic conjunctivitis.  She was last seen on Jul 28, 2022 by Dr. Marlynn Perking..  She reports on November 20, 2022 she had a C-section.  She is currently breast-feeding.  Seasonal and perennial allergic rhinitis: She reports itchy nose at night at times when her medicine is wearing off, rhinorrhea every now and then, and nasal congestion and postnasal drip maybe once a month.  She has not been treated for any sinus infections since we last saw her.  She continues to take fexofenadine 180 mg once a day, Singulair 10 mg daily, and fluticasone nasal spray daily.  She has previously tried immunotherapy but stopped due to concerns for rheumatoid arthritis exacerbations being triggered by the allergy injections.  She is not able to tolerate Xyzal due to dryness and Claritin and Zyrtec caused epistaxis.  She wonders what other options are available.  Mild intermittent asthma: She reports since being on Singulair 10 mg daily, it has been helping her asthma.  She denies cough, wheeze, tightness in chest, shortness of breath, and nocturnal awakenings due to breathing problems.  Since her last office visit she has not required any systemic steroids or made any trips to the emergency room or urgent care due to breathing problems.  She has not had to use her rescue inhaler since being on Singulair.  Allergic conjunctivitis: She reports that she has not really had itchy watery  eyes.  She is now wearing glasses since she works from home.  She has stopped wearing contacts.   Drug Allergies:  Allergies  Allergen Reactions   Latex    Pollen Extract Itching    Review of Systems: Negative except as per HPI   Physical Exam: BP 114/76   Pulse 90   Temp 98 F (36.7 C) (Temporal)   Resp 18   Wt 147 lb 14.4 oz (67.1 kg)   SpO2 97%   BMI 26.20 kg/m    Physical Exam Constitutional:      Appearance: Normal appearance.  HENT:     Head: Normocephalic and atraumatic.     Comments: Pharynx normal, eyes normal, ears normal, nose: Bilateral lower turbinates mildly edematous with no drainage noted    Right Ear: Tympanic membrane, ear canal and external ear normal.     Left Ear: Tympanic membrane, ear canal and external ear normal.     Mouth/Throat:     Mouth: Mucous membranes are moist.     Pharynx: Oropharynx is clear.  Eyes:     Conjunctiva/sclera: Conjunctivae normal.  Cardiovascular:     Rate and Rhythm: Regular rhythm.     Heart sounds: Normal heart sounds.  Pulmonary:     Effort: Pulmonary effort is normal.     Breath sounds: Normal breath sounds.     Comments: Lungs clear to auscultation Musculoskeletal:     Cervical back: Neck supple.  Skin:    General: Skin is warm.  Neurological:     Mental Status: She is alert and  oriented to person, place, and time.  Psychiatric:        Mood and Affect: Mood normal.        Behavior: Behavior normal.        Thought Content: Thought content normal.        Judgment: Judgment normal.     Diagnostics: FVC 2.82 L (90%), FEV1 2.39 L (91%), FEV1/FVC 0.85.  Predicted FVC 3.12 L, predicted FEV1 2.64 L.  Spirometry indicates normal respiratory function  Assessment and Plan: 1. Seasonal and perennial allergic rhinitis   2. Mild intermittent asthma without complication   3. Allergic conjunctivitis of both eyes     Meds ordered this encounter  Medications   fluticasone (FLONASE) 50 MCG/ACT nasal spray     Sig: Place 1 spray in each nostril twice a day as needed for stuffy nose    Dispense:  16 g    Refill:  5   montelukast (SINGULAIR) 10 MG tablet    Sig: TAKE 1 TABLET(10 MG) BY MOUTH AT BEDTIME    Dispense:  90 tablet    Refill:  1   albuterol (VENTOLIN HFA) 108 (90 Base) MCG/ACT inhaler    Sig: 2 puffs every 4-6 hours if needed for coughing or wheezing spells    Dispense:  18 g    Refill:  1    Patient Instructions  Seasonal and perennial rhinitis: moderately controlled - Previous testing September 2020 skin testing positive to grass pollen, tree pollen, dust mite, cat, horse, mold #1, mold #2, mold #3, mold #4, and cockroach -Continue avoidance measures:  - Continue with: Allegra (fexofenadine) 180mg  table once daily and Singulair (montelukast) 10mg  daily - Continue with:  Flonase 1 spray per nostril 1-2 times a day  - You can use an extra dose of the antihistamine, if needed, for breakthrough symptoms.  - Consider nasal saline rinses 1-2 times daily to remove allergens from the nasal cavities as well as help with mucous clearance (this is especially helpful to do before the nasal sprays are given) -Consider stopping Allegra and trying carbinoxamine once finished breast feeding  Mild intermittent asthma:well controlled Your breathing test looks great today PLAN: . - Daily controller medication(s): Singulair 10mg  daily - Prior to physical activity: albuterol 2 puffs 10-15 minutes before physical activity. - Rescue medications: albuterol 4 puffs every 4-6 hours as needed -  Get Influenza Vaccine and appropriate Pneumonia and COVID 19 boosters  - Asthma control goals:  * Full participation in all desired activities (may need albuterol before activity) * Albuterol use two time or less a week on average (not counting use with activity) * Cough interfering with sleep two time or less a month * Oral steroids no more than once a year * No hospitalizations  Allergic  Conjunctivitis:controlled - Avoiding rubbing eyes, if irritated use a wet wash cloth to wipe allergen out of eyes  - Continue Pataday 0.2% eyedrops once a day as needed  - Avoid eye drops that say red eye relief as they may contain medications that dry out your eyes.  Follow up: 6 months or sooner if problems      Return in about 6 months (around 10/28/2023), or if symptoms worsen or fail to improve.    Thank you for the opportunity to care for this patient.  Please do not hesitate to contact me with questions.  Nehemiah Settle, FNP [facility]

## 2023-10-27 ENCOUNTER — Other Ambulatory Visit: Payer: Self-pay | Admitting: Family

## 2023-11-02 ENCOUNTER — Other Ambulatory Visit: Payer: Self-pay | Admitting: Internal Medicine

## 2023-11-02 ENCOUNTER — Ambulatory Visit (INDEPENDENT_AMBULATORY_CARE_PROVIDER_SITE_OTHER): Admitting: Internal Medicine

## 2023-11-02 ENCOUNTER — Encounter: Payer: Self-pay | Admitting: Internal Medicine

## 2023-11-02 VITALS — BP 118/78 | HR 84 | Temp 98.4°F | Resp 20 | Ht 63.0 in | Wt 133.0 lb

## 2023-11-02 DIAGNOSIS — H1013 Acute atopic conjunctivitis, bilateral: Secondary | ICD-10-CM

## 2023-11-02 DIAGNOSIS — R040 Epistaxis: Secondary | ICD-10-CM | POA: Diagnosis not present

## 2023-11-02 DIAGNOSIS — J4520 Mild intermittent asthma, uncomplicated: Secondary | ICD-10-CM

## 2023-11-02 DIAGNOSIS — J3089 Other allergic rhinitis: Secondary | ICD-10-CM

## 2023-11-02 MED ORDER — ALBUTEROL SULFATE HFA 108 (90 BASE) MCG/ACT IN AERS: INHALATION_SPRAY | RESPIRATORY_TRACT | 1 refills | Status: AC

## 2023-11-02 MED ORDER — CROMOLYN SODIUM 4 % OP SOLN
1.0000 [drp] | Freq: Four times a day (QID) | OPHTHALMIC | 3 refills | Status: DC | PRN
Start: 2023-11-02 — End: 2023-11-05

## 2023-11-02 MED ORDER — CARBINOXAMINE MALEATE 4 MG PO TABS: 4.0000 mg | ORAL_TABLET | Freq: Two times a day (BID) | ORAL | 5 refills | Status: AC | PRN

## 2023-11-02 MED ORDER — MONTELUKAST SODIUM 10 MG PO TABS: ORAL_TABLET | ORAL | 1 refills | Status: AC

## 2023-11-02 MED ORDER — XHANCE 93 MCG/ACT NA EXHU: 1.0000 | INHALANT_SUSPENSION | Freq: Two times a day (BID) | NASAL | 5 refills | Status: AC | PRN

## 2023-11-02 NOTE — Patient Instructions (Addendum)
 Seasonal and perennial rhinitis: moderately controlled - Previous testing September 2020 skin testing positive to grass pollen, tree pollen, dust mite, cat, horse, mold #1, mold #2, mold #3, mold #4, and cockroach -Continue avoidance measures:  - Start with: carbinoxamine  4 mg twice daily as needed and continue with and Singulair  (montelukast ) 10mg  daily - Start with:  Xhance  1 spray per nostril 1-2 times a day as needed-use less of epistaxis - You can use an extra dose of the antihistamine, if needed, for breakthrough symptoms.  - Consider nasal saline rinses 1-2 times daily to remove allergens from the nasal cavities as well as help with mucous clearance (this is especially helpful to do before the nasal sprays are given) -Consider stopping Allegra  and trying carbinoxamine  once finished breast feeding  Mild intermittent asthma:well controlled Your breathing test looks great today PLAN: . - Daily controller medication(s): Singulair  10mg  daily - Prior to physical activity: albuterol  2 puffs 10-15 minutes before physical activity. - Rescue medications: albuterol  4 puffs every 4-6 hours as needed -  Get Influenza Vaccine and appropriate Pneumonia and COVID 19 boosters  - Asthma control goals:  * Full participation in all desired activities (may need albuterol  before activity) * Albuterol  use two time or less a week on average (not counting use with activity) * Cough interfering with sleep two time or less a month * Oral steroids no more than once a year * No hospitalizations  Allergic Conjunctivitis:controlled - Avoiding rubbing eyes, if irritated use a wet wash cloth to wipe allergen out of eyes  - Continue cromolyn  1 drop in each eye up to 4 times daily as needed - Avoid eye drops that say red eye relief as they may contain medications that dry out your eyes.  Follow up: 6 months or sooner if problems  It was a pleasure meeting you in clinic today! Thank you for allowing me to  participate in your care.  Rocky Endow, MD [facility]

## 2023-11-02 NOTE — Progress Notes (Signed)
 FOLLOW UP Date of Service/Encounter:   11/02/2023  Subjective:  Julie Hancock (DOB: 1991-03-05) is a 33 y.o. female who [facility] in re-evaluation of the following: Intermittent asthma and allergic rhinitis History obtained from: chart review and patient.  For Review, LV was on 04/30/2023 with Julie Craze, FNP seen for routine follow-up. See below for summary of history and diagnostics.   ----------------------------------------------------- Pertinent History/Diagnostics:  Asthma: Intermittent, controlled on singulair . No exercise intolerance Triggers: allergies Allergic Rhinitis:  Previously she was on AIT however allergy injections were stopped due to concern for rheumatoid arthritis exacerbations being triggered by allergy injections. No recurrence of arthritis symptoms since stopping AIT  Previously controlled on singulair , allegra  does not tolerate levocetirizine due to dryness, Claritin or Zyrtec due to epistaxis.  Previously tried Xhance  with no perceived benefit.   --------------------------------------------------- Today presents for follow-up. Discussed the use of AI scribe software for clinical note transcription with the patient, who gave verbal consent to proceed.  History of Present Illness Julie Hancock is a 33 year old female with allergies and asthma who presents with a need for a new allergy medication.  Allergic rhinitis and sinus symptoms - Long-standing allergic rhinitis with persistent symptoms despite multiple therapies - Intermittent use of Flonase  for breakthrough nasal congestion, especially upon waking - Prior use of Xyzal , Flonase , and Xhance  resulted in side effects including nasal dryness and epistaxis - Considering switching back to Xhance  for improved sinus relief despite risk of epistaxis - Discontinued allergy immunotherapy due to fatigue, muscle aches, and exacerbation of arthritis  Ocular  pruritus - Increased ocular itching, initially attributed to sunscreen but not related - Pataday available at home for eye symptoms  Asthma and lower respiratory symptoms - Daily use of Singulair , which has reduced frequency of bronchitis associated with sinus infections - No exercise-induced asthma symptoms - Rescue inhaler used only during sinus infections, not on a regular basis - Last sinus infection required antibiotics  Medication tolerability and adverse effects - Xyzal , Flonase , and Xhance  associated with nasal dryness and epistaxis - Allergy shots discontinued due to fatigue, muscle aches, and triggering of arthritis  Breastfeeding status - Currently breastfeeding 11-month-old child - Considering weaning   All medications reviewed by clinical staff and updated in chart. No new pertinent medical or surgical history except as noted in HPI.  ROS: All others negative except as noted per HPI.   Objective:  BP 118/78 (BP Location: Right Arm, Patient Position: Sitting, Cuff Size: Normal)   Pulse 84   Temp 98.4 F (36.9 C) (Oral)   Resp 20   Ht 5' 3 (1.6 m)   Wt 133 lb (60.3 kg)   SpO2 98%   BMI 23.56 kg/m  Body mass index is 23.56 kg/m. Physical Exam: General Appearance:  Alert, cooperative, no distress, appears stated age  Head:  Normocephalic, without obvious abnormality, atraumatic  Eyes:  Conjunctiva clear, EOM's intact  Ears EACs normal bilaterally and normal TMs bilaterally  Nose: Nares normal, hypertrophic turbinates, normal mucosa, and no visible anterior polyps  Throat: Lips, tongue normal; teeth and gums normal, normal posterior oropharynx  Neck: Supple, symmetrical  Lungs:   clear to auscultation bilaterally, Respirations unlabored, no coughing  Heart:  regular rate and rhythm and no murmur, Appears well perfused  Extremities: No edema  Skin: Skin color, texture, turgor normal and no rashes or lesions on visualized portions of skin  Neurologic: No gross  deficits   Labs:  Lab  Orders  No laboratory test(s) ordered today    Spirometry:  Tracings reviewed. Her effort: Good reproducible efforts. FVC: 2.99L FEV1: 2.57L, 90% predicted FEV1/FVC ratio: 0.86 Interpretation: Spirometry consistent with normal pattern.  Please see scanned spirometry results for details.  Assessment/Plan   Seasonal and perennial rhinitis: moderately controlled - Previous testing September 2020 skin testing positive to grass pollen, tree pollen, dust mite, cat, horse, mold #1, mold #2, mold #3, mold #4, and cockroach -Continue avoidance measures:  - Start with: carbinoxamine  4 mg twice daily as needed and continue with and Singulair  (montelukast ) 10mg  daily - Start with:  Xhance  1 spray per nostril 1-2 times a day as needed-use less if epistaxis - You can use an extra dose of the antihistamine, if needed, for breakthrough symptoms.  - Consider nasal saline rinses 1-2 times daily to remove allergens from the nasal cavities as well as help with mucous clearance (this is especially helpful to do before the nasal sprays are given) -Consider stopping Allegra  and trying carbinoxamine  once finished breast feeding  Mild intermittent asthma:well controlled Your breathing test looks great today PLAN: . - Daily controller medication(s): Singulair  10mg  daily - Prior to physical activity: albuterol  2 puffs 10-15 minutes before physical activity. - Rescue medications: albuterol  4 puffs every 4-6 hours as needed -  Get Influenza Vaccine and appropriate Pneumonia and COVID 19 boosters  - Asthma control goals:  * Full participation in all desired activities (may need albuterol  before activity) * Albuterol  use two time or less a week on average (not counting use with activity) * Cough interfering with sleep two time or less a month * Oral steroids no more than once a year * No hospitalizations  Allergic Conjunctivitis:controlled - Avoiding rubbing eyes, if irritated use a  wet wash cloth to wipe allergen out of eyes  - Continue cromolyn  1 drop in each eye up to 4 times daily as needed - Avoid eye drops that say red eye relief as they may contain medications that dry out your eyes.  Follow up: 6 months or sooner if problems  It was a pleasure meeting you in clinic today! Thank you for allowing me to participate in your care.  Rocky Endow, MD [facility]  Other: none  Rocky Endow, MD  [facility] 

## 2023-11-03 ENCOUNTER — Other Ambulatory Visit: Payer: Self-pay | Admitting: Internal Medicine

## 2023-11-03 NOTE — Telephone Encounter (Signed)
 No need to pearson can use OTC pataday.

## 2023-11-04 ENCOUNTER — Other Ambulatory Visit (HOSPITAL_COMMUNITY): Payer: Self-pay

## 2023-11-04 NOTE — Telephone Encounter (Signed)
 Per most recent chart note 11-02-2023. Please advise if PA still required.

## 2023-11-10 NOTE — Telephone Encounter (Signed)
 Yes please process PA. She stated Xhance  was more helpful that regular flonase . She can not do allergy injections due to reactions. She does not tolerate nasal sprays consistently but when as needed, she does get benefit. Thanks

## 2023-11-15 ENCOUNTER — Telehealth: Payer: Self-pay

## 2023-11-15 NOTE — Telephone Encounter (Signed)
*  AA  Pharmacy Patient Advocate Encounter   Received notification from RX Request Messages that prior authorization for Xhance  93MCG/ACT exhaler suspension is required/requested.   Insurance verification completed.   The patient is insured through [facility] .   Per test claim: PA required; PA submitted to above mentioned insurance via Latent Key/confirmation #/EOC [id number] Status is pending

## 2023-11-15 NOTE — Telephone Encounter (Signed)
 Your request has been approved The request has been approved. The authorization is effective from 11/15/2023 to 11/13/2024, as long as the member is enrolled in their current health plan. The request was approved as submitted. A written notification letter will follow with additional details. Authorization Expiration09/09/2024

## 2023-11-16 NOTE — Telephone Encounter (Signed)
 Your request has been approved The request has been approved. The authorization is effective from 11/15/2023 to 11/13/2024, as long as the member is enrolled in their current health plan. The request was approved as submitted. A written notification letter will follow with additional details.
